# Patient Record
Sex: Male | Born: 1960 | Race: White | Hispanic: No | Marital: Married | Smoking: Never smoker
Health system: Southern US, Community
[De-identification: ages and names within clinical notes are randomized; demographics above are authoritative.]

## PROBLEM LIST (undated history)

## (undated) DIAGNOSIS — E785 Hyperlipidemia, unspecified: Secondary | ICD-10-CM

## (undated) DIAGNOSIS — M51369 Other intervertebral disc degeneration, lumbar region without mention of lumbar back pain or lower extremity pain: Secondary | ICD-10-CM

## (undated) DIAGNOSIS — I10 Essential (primary) hypertension: Secondary | ICD-10-CM

## (undated) DIAGNOSIS — E119 Type 2 diabetes mellitus without complications: Secondary | ICD-10-CM

## (undated) DIAGNOSIS — K8020 Calculus of gallbladder without cholecystitis without obstruction: Secondary | ICD-10-CM

## (undated) DIAGNOSIS — G4730 Sleep apnea, unspecified: Secondary | ICD-10-CM

## (undated) DIAGNOSIS — Z87442 Personal history of urinary calculi: Secondary | ICD-10-CM

## (undated) DIAGNOSIS — M5136 Other intervertebral disc degeneration, lumbar region: Secondary | ICD-10-CM

## (undated) HISTORY — PX: CHOLECYSTECTOMY: SHX55

## (undated) HISTORY — PX: LUMBAR MICRODISCECTOMY: SHX99

## (undated) HISTORY — PX: COLONOSCOPY: SHX174

## (undated) HISTORY — PX: HERNIA REPAIR: SHX51

## (undated) HISTORY — PX: BARIATRIC SURGERY: SHX1103

---

## 2005-07-31 ENCOUNTER — Inpatient Hospital Stay: Payer: Self-pay | Admitting: Internal Medicine

## 2010-07-14 ENCOUNTER — Inpatient Hospital Stay: Payer: Self-pay | Admitting: Internal Medicine

## 2010-10-28 ENCOUNTER — Emergency Department: Payer: Self-pay | Admitting: Emergency Medicine

## 2011-06-10 ENCOUNTER — Other Ambulatory Visit: Payer: Self-pay

## 2011-06-16 LAB — CULTURE, BLOOD (SINGLE)

## 2012-08-04 ENCOUNTER — Ambulatory Visit: Payer: Self-pay | Admitting: Gastroenterology

## 2013-08-30 ENCOUNTER — Ambulatory Visit: Payer: Self-pay | Admitting: Cardiology

## 2014-05-07 ENCOUNTER — Ambulatory Visit
Admit: 2014-05-07 | Disposition: A | Discharge status: Home / Self Care | Payer: Self-pay | Attending: Urgent Care | Admitting: Urgent Care

## 2014-06-18 ENCOUNTER — Other Ambulatory Visit: Payer: Self-pay | Admitting: Internal Medicine

## 2014-06-18 DIAGNOSIS — M5136 Other intervertebral disc degeneration, lumbar region: Secondary | ICD-10-CM

## 2014-06-19 ENCOUNTER — Ambulatory Visit: Payer: Self-pay

## 2014-07-20 ENCOUNTER — Ambulatory Visit: Payer: Disability Insurance | Attending: Internal Medicine

## 2014-07-20 ENCOUNTER — Ambulatory Visit: Payer: Self-pay

## 2014-07-20 DIAGNOSIS — E785 Hyperlipidemia, unspecified: Secondary | ICD-10-CM | POA: Insufficient documentation

## 2014-07-20 DIAGNOSIS — I10 Essential (primary) hypertension: Secondary | ICD-10-CM | POA: Diagnosis not present

## 2014-07-20 DIAGNOSIS — Z794 Long term (current) use of insulin: Secondary | ICD-10-CM | POA: Diagnosis not present

## 2014-07-20 DIAGNOSIS — M5136 Other intervertebral disc degeneration, lumbar region: Secondary | ICD-10-CM

## 2014-07-20 DIAGNOSIS — G4730 Sleep apnea, unspecified: Secondary | ICD-10-CM | POA: Diagnosis not present

## 2014-07-20 DIAGNOSIS — E118 Type 2 diabetes mellitus with unspecified complications: Secondary | ICD-10-CM | POA: Insufficient documentation

## 2014-07-24 MED ORDER — ALBUTEROL SULFATE (2.5 MG/3ML) 0.083% IN NEBU: 2.5000 mg | INHALATION_SOLUTION | Freq: Once | RESPIRATORY_TRACT | Status: DC

## 2014-10-10 ENCOUNTER — Other Ambulatory Visit: Payer: Self-pay | Admitting: Specialist

## 2014-10-19 ENCOUNTER — Ambulatory Visit
Admission: RE | Admit: 2014-10-19 | Discharge: 2014-10-19 | Disposition: A | Discharge status: Home / Self Care | Payer: BLUE CROSS/BLUE SHIELD | Source: Ambulatory Visit | Attending: Specialist | Admitting: Specialist

## 2014-10-19 DIAGNOSIS — Z01818 Encounter for other preprocedural examination: Secondary | ICD-10-CM | POA: Diagnosis present

## 2020-01-27 ENCOUNTER — Emergency Department: Payer: Medicare HMO

## 2020-01-27 ENCOUNTER — Emergency Department
Admission: EM | Admit: 2020-01-27 | Discharge: 2020-01-27 | Disposition: A | Discharge status: Home / Self Care | Payer: Medicare HMO | Attending: Student in an Organized Health Care Education/Training Program | Admitting: Student in an Organized Health Care Education/Training Program

## 2020-01-27 ENCOUNTER — Other Ambulatory Visit: Payer: Self-pay

## 2020-01-27 DIAGNOSIS — E119 Type 2 diabetes mellitus without complications: Secondary | ICD-10-CM | POA: Insufficient documentation

## 2020-01-27 DIAGNOSIS — S82851A Displaced trimalleolar fracture of right lower leg, initial encounter for closed fracture: Secondary | ICD-10-CM | POA: Diagnosis not present

## 2020-01-27 DIAGNOSIS — I10 Essential (primary) hypertension: Secondary | ICD-10-CM | POA: Insufficient documentation

## 2020-01-27 DIAGNOSIS — W541XXA Struck by dog, initial encounter: Secondary | ICD-10-CM | POA: Insufficient documentation

## 2020-01-27 DIAGNOSIS — Z20822 Contact with and (suspected) exposure to covid-19: Secondary | ICD-10-CM | POA: Diagnosis not present

## 2020-01-27 DIAGNOSIS — Y93K9 Activity, other involving animal care: Secondary | ICD-10-CM | POA: Diagnosis not present

## 2020-01-27 DIAGNOSIS — S99911A Unspecified injury of right ankle, initial encounter: Secondary | ICD-10-CM | POA: Diagnosis present

## 2020-01-27 HISTORY — DX: Type 2 diabetes mellitus without complications: E11.9

## 2020-01-27 HISTORY — DX: Essential (primary) hypertension: I10

## 2020-01-27 LAB — RESP PANEL BY RT-PCR (FLU A&B, COVID) ARPGX2
Influenza A by PCR: NEGATIVE
Influenza B by PCR: NEGATIVE
SARS Coronavirus 2 by RT PCR: NEGATIVE

## 2020-01-27 MED ORDER — HYDROMORPHONE HCL 1 MG/ML IJ SOLN
1.0000 mg | INTRAMUSCULAR | Status: DC | PRN
  Administered 2020-01-27: 1 mg via INTRAVENOUS

## 2020-01-27 MED ORDER — MORPHINE SULFATE (PF) 4 MG/ML IV SOLN
4.0000 mg | Freq: Once | INTRAVENOUS | Status: AC
  Administered 2020-01-27: 4 mg via INTRAVENOUS
  Filled 2020-01-27: qty 1

## 2020-01-27 MED ORDER — ONDANSETRON HCL 4 MG PO TABS: 4.0000 mg | ORAL_TABLET | Freq: Every day | ORAL | 0 refills | Status: AC | PRN

## 2020-01-27 MED ORDER — HYDROCODONE-ACETAMINOPHEN 5-325 MG PO TABS: 1.0000 | ORAL_TABLET | ORAL | 0 refills | Status: DC | PRN

## 2020-01-27 MED ORDER — HYDROMORPHONE HCL 1 MG/ML IJ SOLN
1.0000 mg | INTRAMUSCULAR | Status: DC | PRN
  Administered 2020-01-27: 1 mg via INTRAVENOUS
  Filled 2020-01-27 (×2): qty 1

## 2020-01-27 MED ORDER — ONDANSETRON HCL 4 MG/2ML IJ SOLN
4.0000 mg | Freq: Once | INTRAMUSCULAR | Status: AC
  Administered 2020-01-27: 4 mg via INTRAVENOUS
  Filled 2020-01-27: qty 2

## 2020-01-27 NOTE — ED Triage Notes (Signed)
Pt to ED from home, was hit by large dog around 1230pm today and has R ankle deformity with eversion of R ankle and swelling and bruising. Capillary refill <3s both feet. Pain level 6/10, aching. Pt has hx DM type 2 with bariatric surgery in 2016.

## 2020-01-27 NOTE — ED Notes (Signed)
Pain level 6/10. Dilaudid given per order for splint placement.

## 2020-01-27 NOTE — ED Notes (Signed)
Pt to ED with obvious R ankle deformity. Was hit by large dog. Pain level 6/10, throbbing.

## 2020-01-27 NOTE — Discharge Instructions (Signed)
Please keep leg elevated to help reduce swelling.  Call [facility] to schedule follow up appointment this coming week.  Make sure you are taking a daily aspirin to help reduce risk of blood clots.  Use crutches and do not put weight on your right foot.  Return to the ER if you have worsening pain, swelling, numbness or tingling or for any additional questions or concerns.

## 2020-01-27 NOTE — ED Notes (Signed)
EDP aware of neurovascular status, RLE. Pt has normal sensation in bilateral extremities.

## 2020-01-27 NOTE — ED Provider Notes (Signed)
[facility] Emergency Department Provider Note    Event Date/Time   First MD Initiated Contact with Patient 01/27/20 1347     (approximate)  I have reviewed the triage vital signs and the nursing notes.   HISTORY  Chief Complaint Ankle Pain and ankle deformity (Result of injury/)    HPI Taylor Massey is a 59 y.o. male below listed past medical history presents to the ER for evaluation of acute right ankle pain that occurred while he was playing with his dog today.  Dog ran into the back of his leg he felt a popping sensation in his ankle as he was making a turn.  Denies hitting his head.  No other associated pain.  States his pain is mild to moderate.  No numbness tingling.    Past Medical History:  Diagnosis Date  . Diabetes mellitus without complication (HCC)   . Hypertension    History reviewed. No pertinent family history.  There are no problems to display for this patient.     Prior to Admission medications   Medication Sig Start Date End Date Taking? Authorizing Provider  HYDROcodone-acetaminophen (NORCO) 5-325 MG tablet Take 1 tablet by mouth every 4 (four) hours as needed for moderate pain. 01/27/20   Willy Eddy, MD  ondansetron (ZOFRAN) 4 MG tablet Take 1 tablet (4 mg total) by mouth daily as needed. 01/27/20 01/26/21  Willy Eddy, MD    Allergies Patient has no known allergies.    Social History Social History   Tobacco Use  . Smoking status: Never Smoker  . Smokeless tobacco: Never Used  Substance Use Topics  . Alcohol use: Not Currently  . Drug use: Never    Review of Systems Patient denies headaches, rhinorrhea, blurry vision, numbness, shortness of breath, chest pain, edema, cough, abdominal pain, nausea, vomiting, diarrhea, dysuria, fevers, rashes or hallucinations unless otherwise stated above in HPI. ____________________________________________   PHYSICAL EXAM:  VITAL SIGNS: Vitals:   01/27/20  1414 01/27/20 1500  BP: 135/75 121/76  Pulse: (!) 59 (!) 53  Resp: 17 16  Temp: 98.7 F (37.1 C)   SpO2: 98% 100%    Constitutional: Alert and oriented.  Eyes: Conjunctivae are normal.  Head: Atraumatic. Nose: No congestion/rhinnorhea. Mouth/Throat: Mucous membranes are moist.   Neck: No stridor. Painless ROM.  Cardiovascular: Normal rate, regular rhythm. Grossly normal heart sounds.  Good peripheral circulation. Respiratory: Normal respiratory effort.  No retractions. Lungs CTAB. Gastrointestinal: Soft and nontender. No distention. No abdominal bruits. No CVA tenderness. Genitourinary:  Musculoskeletal: Lateral rotational deformity of the right ankle with swelling and ecchymosis.  No puncture wound laceration or abrasion.  Neurovascular intact distally.  No pain at proximal knee.  No joint effusions. Neurologic:  Normal speech and language. No gross focal neurologic deficits are appreciated. No facial droop Skin:  Skin is warm, dry and intact. No rash noted. Psychiatric: Mood and affect are normal. Speech and behavior are normal.  ____________________________________________   LABS (all labs ordered are listed, but only abnormal results are displayed)  Results for orders placed or performed during the hospital encounter of 01/27/20 (from the past 24 hour(s))  Resp Panel by RT-PCR (Flu A&B, Covid) Nasopharyngeal Swab     Status: None   Collection Time: 01/27/20  1:52 PM   Specimen: Nasopharyngeal Swab; Nasopharyngeal(NP) swabs in vial transport medium  Result Value Ref Range   SARS Coronavirus 2 by RT PCR NEGATIVE NEGATIVE   Influenza A by PCR NEGATIVE NEGATIVE  Influenza B by PCR NEGATIVE NEGATIVE   ____________________________________________ ____________________________________________  RADIOLOGY  I personally reviewed all radiographic images ordered to evaluate for the above acute complaints and reviewed radiology reports and findings.  These findings were personally  discussed with the patient.  Please see medical record for radiology report.  ____________________________________________   PROCEDURES  Procedure(s) performed:  .Ortho Injury Treatment  Date/Time: 01/27/2020 3:39 PM Performed by: Willy Eddy, MD Authorized by: Willy Eddy, MD   Consent:    Consent obtained:  Verbal   Consent given by:  Patient   Risks discussed:  Fracture, irreducible dislocation, recurrent dislocation, nerve damage, stiffness, vascular damage and restricted joint movement   Alternatives discussed:  Alternative treatment, immobilization and no treatmentInjury location: ankle Location details: right ankle Injury type: fracture Fracture type: trimalleolar Pre-procedure neurovascular assessment: neurovascularly intact Manipulation performed: yes Skin traction used: yes Reduction successful: yes X-ray confirmed reduction: yes Immobilization: splint Splint type: short leg Supplies used: Ortho-Glass Post-procedure neurovascular assessment: post-procedure neurovascularly intact       Critical Care performed: no ____________________________________________   INITIAL IMPRESSION / ASSESSMENT AND PLAN / ED COURSE  Pertinent labs & imaging results that were available during my care of the patient were reviewed by me and considered in my medical decision making (see chart for details).   DDX: Fracture, contusion, dislocation  Taylor Massey is a 59 y.o. who presents to the ED with evidence of right ankle injury as described above.  Bedside x-ray shows evidence of bimalleolar fracture however given mechanism suspect a component of posterior malleolar involvement as well.  He is neurovascularly intact.  Will give IV narcotic medication.  Will consult Ortho  Clinical Course as of 01/27/20 1546  Sat Jan 27, 2020  1422 Case was discussed in consultation with Dr. Loralie Champagne of orthopedics.  Does recommend reduction but no need for additional imaging of the  ear in the ER.  On reassessment patient pain fairly well controlled.  He does not have any proximal knee pain.  Neurovascular intact. [PR]  1518 Postreduction film shows improved alignment does appear to have a component of posterior malleoli are involvement but is appropriately positioned.  Images evaluated by Dr. Loralie Champagne.  Ok for follow with ortho clinic.  Will give crutches. [PR]    Clinical Course User Index [PR] Willy Eddy, MD    The patient was evaluated in Emergency Department today for the symptoms described in the history of present illness. He/she was evaluated in the context of the global COVID-19 pandemic, which necessitated consideration that the patient might be at risk for infection with the SARS-CoV-2 virus that causes COVID-19. Institutional protocols and algorithms that pertain to the evaluation of patients at risk for COVID-19 are in a state of rapid change based on information released by regulatory bodies including the CDC and federal and state organizations. These policies and algorithms were followed during the patient's care in the ED.  As part of my medical decision making, I reviewed the following data within the electronic MEDICAL RECORD NUMBER Nursing notes reviewed and incorporated, Labs reviewed, notes from prior ED visits and [place] Controlled Substance Database   ____________________________________________   FINAL CLINICAL IMPRESSION(S) / ED DIAGNOSES  Final diagnoses:  Trimalleolar fracture of ankle, closed, right, initial encounter      NEW MEDICATIONS STARTED DURING THIS VISIT:  New Prescriptions   HYDROCODONE-ACETAMINOPHEN (NORCO) 5-325 MG TABLET    Take 1 tablet by mouth every 4 (four) hours as needed for moderate pain.   ONDANSETRON (  ZOFRAN) 4 MG TABLET    Take 1 tablet (4 mg total) by mouth daily as needed.     Note:  This document was prepared using Dragon voice recognition software and may include unintentional dictation errors.    Willy Eddy, MD 01/27/20 [phone]

## 2020-01-27 NOTE — ED Notes (Addendum)
EDP at bedside performing manual reduction. Verbal order for second dilaudid injection.

## 2020-01-27 NOTE — ED Notes (Signed)
X-ray at bedside

## 2020-01-27 NOTE — ED Notes (Signed)
EDP at bedside  

## 2020-01-27 NOTE — ED Notes (Signed)
Pt feels ready to attempt ambulation in room with crutches. VS normal.

## 2020-01-27 NOTE — ED Notes (Signed)
Capillary refill noted to be <3sec both feet. Unable to feel pedal pulses. Will obtain pedal doppler. Xray at bedside.

## 2020-01-27 NOTE — ED Notes (Signed)
RR on vital signs is 16.

## 2020-01-31 ENCOUNTER — Other Ambulatory Visit: Payer: Self-pay

## 2020-01-31 ENCOUNTER — Other Ambulatory Visit
Admission: RE | Admit: 2020-01-31 | Discharge: 2020-01-31 | Disposition: A | Discharge status: Home / Self Care | Payer: Medicare HMO | Source: Ambulatory Visit | Attending: Podiatry | Admitting: Podiatry

## 2020-01-31 ENCOUNTER — Encounter
Admission: RE | Admit: 2020-01-31 | Discharge: 2020-01-31 | Disposition: A | Discharge status: Home / Self Care | Payer: Medicare HMO | Source: Ambulatory Visit | Attending: Podiatry | Admitting: Podiatry

## 2020-01-31 DIAGNOSIS — Z20822 Contact with and (suspected) exposure to covid-19: Secondary | ICD-10-CM | POA: Insufficient documentation

## 2020-01-31 DIAGNOSIS — Z01818 Encounter for other preprocedural examination: Secondary | ICD-10-CM | POA: Diagnosis not present

## 2020-01-31 HISTORY — DX: Other intervertebral disc degeneration, lumbar region: M51.36

## 2020-01-31 HISTORY — DX: Hyperlipidemia, unspecified: E78.5

## 2020-01-31 HISTORY — DX: Calculus of gallbladder without cholecystitis without obstruction: K80.20

## 2020-01-31 HISTORY — DX: Sleep apnea, unspecified: G47.30

## 2020-01-31 HISTORY — DX: Other intervertebral disc degeneration, lumbar region without mention of lumbar back pain or lower extremity pain: M51.369

## 2020-01-31 LAB — CBC
HCT: 37.4 % — ABNORMAL LOW (ref 39.0–52.0)
Hemoglobin: 12.6 g/dL — ABNORMAL LOW (ref 13.0–17.0)
MCH: 33.1 pg (ref 26.0–34.0)
MCHC: 33.7 g/dL (ref 30.0–36.0)
MCV: 98.2 fL (ref 80.0–100.0)
Platelets: 140 10*3/uL — ABNORMAL LOW (ref 150–400)
RBC: 3.81 MIL/uL — ABNORMAL LOW (ref 4.22–5.81)
RDW: 12.4 % (ref 11.5–15.5)
WBC: 5.5 10*3/uL (ref 4.0–10.5)
nRBC: 0 % (ref 0.0–0.2)

## 2020-01-31 LAB — COMPREHENSIVE METABOLIC PANEL
ALT: 25 U/L (ref 0–44)
AST: 28 U/L (ref 15–41)
Albumin: 3.7 g/dL (ref 3.5–5.0)
Alkaline Phosphatase: 91 U/L (ref 38–126)
Anion gap: 8 (ref 5–15)
BUN: 20 mg/dL (ref 6–20)
CO2: 26 mmol/L (ref 22–32)
Calcium: 8.8 mg/dL — ABNORMAL LOW (ref 8.9–10.3)
Chloride: 105 mmol/L (ref 98–111)
Creatinine, Ser: 0.99 mg/dL (ref 0.61–1.24)
GFR, Estimated: >60 mL/min (ref >60)
Glucose, Bld: 207 mg/dL — ABNORMAL HIGH (ref 70–99)
Potassium: 4.2 mmol/L (ref 3.5–5.1)
Sodium: 139 mmol/L (ref 135–145)
Total Bilirubin: 1 mg/dL (ref 0.3–1.2)
Total Protein: 6.8 g/dL (ref 6.5–8.1)

## 2020-01-31 LAB — SARS CORONAVIRUS 2 (TAT 6-24 HRS): SARS Coronavirus 2: NEGATIVE

## 2020-01-31 NOTE — Patient Instructions (Signed)
Your procedure is scheduled on: February 01, 2020 THURSDAY Report to the Registration Desk on the 1st floor of the [facility] To find out your arrival time, please call [phone] between 1PM - 3PM on: Wednesday January 31, 2020  REMEMBER: Instructions that are not followed completely may result in serious medical risk, up to and including death; or upon the discretion of your surgeon and anesthesiologist your surgery may need to be rescheduled.  Do not eat food after midnight the night before surgery.  No gum chewing, lozengers or hard candies.  You may however, drink CLEAR liquids up to 2 hours before you are scheduled to arrive for your surgery. Do not drink anything within 2 hours of your scheduled arrival time.  Clear liquids include: - water   Type 1 and Type 2 diabetics should only drink water.  TAKE THESE MEDICATIONS THE MORNING OF SURGERY WITH A SIP OF WATER: ATORVASTATIN - IF YOU USUALLY TAKE THAT IN THE MORNING  DO NOT TAKE BENAZEPRIL DAY OF SURGERY  Follow recommendations from Cardiologist, Pulmonologist or PCP regarding stopping Aspirin.  One week prior to surgery: Stop Anti-inflammatories (NSAIDS) such as Advil, Aleve, Ibuprofen, Motrin, Naproxen, Naprosyn and Aspirin based products such as Excedrin, Goodys Powder, BC Powder. Stop ANY OVER THE COUNTER supplements until after surgery. (However, you may continue taking Vitamin D, Vitamin B, and multivitamin up until the day before surgery.)  No Alcohol for 24 hours before or after surgery.  No Smoking including e-cigarettes for 24 hours prior to surgery.  No chewable tobacco products for at least 6 hours prior to surgery.  No nicotine patches on the day of surgery.  Do not use any "recreational" drugs for at least a week prior to your surgery.  Please be advised that the combination of cocaine and anesthesia may have negative outcomes, up to and including death. If you test positive for cocaine, your surgery  will be cancelled.  On the morning of surgery brush your teeth with toothpaste and water, you may rinse your mouth with mouthwash if you wish. Do not swallow any toothpaste or mouthwash.  Do not wear jewelry, make-up, hairpins, clips or nail polish.  Do not wear lotions, powders, or perfumes.   Do not shave body from the neck down 48 hours prior to surgery just in case you cut yourself which could leave a site for infection.  Also, freshly shaved skin may become irritated if using the CHG soap.  Contact lenses, hearing aids and dentures may not be worn into surgery.  Do not bring valuables to the hospital. [facility] is not responsible for any missing/lost belongings or valuables.   Use CHG Soap  as directed on instruction sheet.  If you use a C-PAP bring your C-PAP to the hospital with you in case you may have to spend the night.   Notify your doctor if there is any change in your medical condition (cold, fever, infection).  Wear comfortable clothing (specific to your surgery type) to the hospital.  Plan for stool softeners for home use; pain medications have a tendency to cause constipation. You can also help prevent constipation by eating foods high in fiber such as fruits and vegetables and drinking plenty of fluids as your diet allows.  After surgery, you can help prevent lung complications by doing breathing exercises.  Take deep breaths and cough every 1-2 hours. Your doctor may order a device called an Incentive Spirometer to help you take deep breaths. When coughing or  sneezing, hold a pillow firmly against your incision with both hands. This is called "splinting." Doing this helps protect your incision. It also decreases belly discomfort.  If you are being admitted to the hospital overnight, leave your suitcase in the car. After surgery it may be brought to your room.  If you are being discharged the day of surgery, you will not be allowed to drive home. You will need a  responsible adult (18 years or older) to drive you home and stay with you that night.   Please call the Pre-admissions Testing Dept. at [phone] if you have any questions about these instructions.  Visitation Policy:  Patients undergoing a surgery or procedure may have one family member or support person with them as long as that person is not COVID-19 positive or experiencing its symptoms.  That person may remain in the waiting area during the procedure.  Inpatient Visitation Update:   In an effort to ensure the safety of our team members and our patients, we are implementing a change to our visitation policy:  Effective Monday, Aug. 9, at 7 a.m., inpatients will be allowed one support person.  o The support person may change daily.  o The support person must pass our screening, gel in and out, and wear a mask at all times, including in the patient's room.  o Patients must also wear a mask when staff or their support person are in the room.  o Masking is required regardless of vaccination status.  Systemwide, no visitors 17 or younger.

## 2020-02-01 ENCOUNTER — Ambulatory Visit: Payer: Medicare HMO | Admitting: Anesthesiology

## 2020-02-01 ENCOUNTER — Other Ambulatory Visit: Payer: Self-pay

## 2020-02-01 ENCOUNTER — Observation Stay: Payer: Medicare HMO

## 2020-02-01 ENCOUNTER — Ambulatory Visit: Payer: Medicare HMO

## 2020-02-01 ENCOUNTER — Encounter: Payer: Self-pay | Admitting: Podiatry

## 2020-02-01 ENCOUNTER — Encounter
Admission: RE | Disposition: A | Discharge status: Home / Self Care | Payer: Self-pay | Source: Home / Self Care | Attending: Podiatry

## 2020-02-01 ENCOUNTER — Observation Stay
Admission: RE | Admit: 2020-02-01 | Discharge: 2020-02-01 | Disposition: A | Discharge status: Home / Self Care | Payer: Medicare HMO | Attending: Podiatry | Admitting: Podiatry

## 2020-02-01 DIAGNOSIS — I10 Essential (primary) hypertension: Secondary | ICD-10-CM | POA: Diagnosis not present

## 2020-02-01 DIAGNOSIS — S82851A Displaced trimalleolar fracture of right lower leg, initial encounter for closed fracture: Secondary | ICD-10-CM | POA: Diagnosis not present

## 2020-02-01 DIAGNOSIS — X58XXXA Exposure to other specified factors, initial encounter: Secondary | ICD-10-CM | POA: Insufficient documentation

## 2020-02-01 DIAGNOSIS — S82891A Other fracture of right lower leg, initial encounter for closed fracture: Secondary | ICD-10-CM | POA: Diagnosis present

## 2020-02-01 DIAGNOSIS — M6281 Muscle weakness (generalized): Secondary | ICD-10-CM | POA: Insufficient documentation

## 2020-02-01 DIAGNOSIS — E785 Hyperlipidemia, unspecified: Secondary | ICD-10-CM | POA: Diagnosis present

## 2020-02-01 DIAGNOSIS — E119 Type 2 diabetes mellitus without complications: Secondary | ICD-10-CM

## 2020-02-01 DIAGNOSIS — S99911A Unspecified injury of right ankle, initial encounter: Secondary | ICD-10-CM | POA: Diagnosis present

## 2020-02-01 DIAGNOSIS — Z09 Encounter for follow-up examination after completed treatment for conditions other than malignant neoplasm: Secondary | ICD-10-CM

## 2020-02-01 DIAGNOSIS — G4733 Obstructive sleep apnea (adult) (pediatric): Secondary | ICD-10-CM | POA: Diagnosis present

## 2020-02-01 DIAGNOSIS — Z419 Encounter for procedure for purposes other than remedying health state, unspecified: Secondary | ICD-10-CM

## 2020-02-01 HISTORY — PX: ORIF ANKLE FRACTURE: SHX5408

## 2020-02-01 LAB — GLUCOSE, CAPILLARY
Glucose-Capillary: 150 mg/dL — ABNORMAL HIGH (ref 70–99)
Glucose-Capillary: 125 mg/dL — ABNORMAL HIGH (ref 70–99)

## 2020-02-01 SURGERY — OPEN REDUCTION INTERNAL FIXATION (ORIF) ANKLE FRACTURE
Anesthesia: General | Site: Ankle | Laterality: Right

## 2020-02-01 MED ORDER — ACETAMINOPHEN 10 MG/ML IV SOLN
INTRAVENOUS | Status: AC
  Filled 2020-02-01: qty 100

## 2020-02-01 MED ORDER — KETOROLAC TROMETHAMINE 30 MG/ML IJ SOLN
INTRAMUSCULAR | Status: AC
  Filled 2020-02-01: qty 1

## 2020-02-01 MED ORDER — ENOXAPARIN SODIUM 40 MG/0.4ML ~~LOC~~ SOLN: 40.0000 mg | SUBCUTANEOUS | 0 refills | Status: DC

## 2020-02-01 MED ORDER — CHLORHEXIDINE GLUCONATE 0.12 % MT SOLN
OROMUCOSAL | Status: AC
  Administered 2020-02-01: 06:00:00 15 mL via OROMUCOSAL
  Filled 2020-02-01: qty 15

## 2020-02-01 MED ORDER — LIDOCAINE HCL (PF) 1 % IJ SOLN
INTRAMUSCULAR | Status: AC
  Filled 2020-02-01: qty 5

## 2020-02-01 MED ORDER — INSULIN ASPART 100 UNIT/ML ~~LOC~~ SOLN: 0.0000 [IU] | Freq: Three times a day (TID) | SUBCUTANEOUS | Status: DC

## 2020-02-01 MED ORDER — ACETAMINOPHEN 325 MG PO TABS: 325.0000 mg | ORAL_TABLET | Freq: Four times a day (QID) | ORAL | Status: DC | PRN

## 2020-02-01 MED ORDER — ACETAMINOPHEN 325 MG PO TABS: 650.0000 mg | ORAL_TABLET | Freq: Four times a day (QID) | ORAL | Status: DC | PRN

## 2020-02-01 MED ORDER — OCUVITE-LUTEIN PO CAPS
1.0000 | ORAL_CAPSULE | Freq: Every day | ORAL | Status: DC
  Filled 2020-02-01: qty 1

## 2020-02-01 MED ORDER — CEFAZOLIN SODIUM-DEXTROSE 2-4 GM/100ML-% IV SOLN
INTRAVENOUS | Status: AC
  Filled 2020-02-01: qty 100

## 2020-02-01 MED ORDER — SODIUM CHLORIDE FLUSH 0.9 % IV SOLN
INTRAVENOUS | Status: AC
  Filled 2020-02-01: qty 50

## 2020-02-01 MED ORDER — PROPOFOL 10 MG/ML IV BOLUS
INTRAVENOUS | Status: AC
  Filled 2020-02-01: qty 20

## 2020-02-01 MED ORDER — BENAZEPRIL HCL 20 MG PO TABS
20.0000 mg | ORAL_TABLET | Freq: Every day | ORAL | Status: DC
  Filled 2020-02-01: qty 1

## 2020-02-01 MED ORDER — FENTANYL CITRATE (PF) 100 MCG/2ML IJ SOLN: 25.0000 ug | INTRAMUSCULAR | Status: DC | PRN

## 2020-02-01 MED ORDER — MIDAZOLAM HCL 2 MG/2ML IJ SOLN
INTRAMUSCULAR | Status: AC
  Administered 2020-02-01: 08:00:00 1 mg via INTRAVENOUS
  Filled 2020-02-01: qty 2

## 2020-02-01 MED ORDER — ONDANSETRON HCL 4 MG/2ML IJ SOLN
INTRAMUSCULAR | Status: DC | PRN
  Administered 2020-02-01: 4 mg via INTRAVENOUS

## 2020-02-01 MED ORDER — CEFAZOLIN SODIUM-DEXTROSE 2-4 GM/100ML-% IV SOLN
2.0000 g | INTRAVENOUS | Status: AC
  Administered 2020-02-01: 2 g via INTRAVENOUS

## 2020-02-01 MED ORDER — ASPIRIN EC 81 MG PO TBEC: 81.0000 mg | DELAYED_RELEASE_TABLET | Freq: Two times a day (BID) | ORAL | 11 refills | Status: AC

## 2020-02-01 MED ORDER — ATORVASTATIN CALCIUM 10 MG PO TABS
10.0000 mg | ORAL_TABLET | Freq: Every day | ORAL | Status: DC
Start: 2020-02-01 — End: 2020-02-05
  Filled 2020-02-01: qty 1

## 2020-02-01 MED ORDER — INSULIN ASPART 100 UNIT/ML ~~LOC~~ SOLN: 0.0000 [IU] | Freq: Every day | SUBCUTANEOUS | Status: DC

## 2020-02-01 MED ORDER — FENTANYL CITRATE (PF) 100 MCG/2ML IJ SOLN
INTRAMUSCULAR | Status: DC | PRN
  Administered 2020-02-01 (×2): 50 ug via INTRAVENOUS

## 2020-02-01 MED ORDER — SODIUM CHLORIDE 0.9 % IV SOLN: INTRAVENOUS | Status: DC

## 2020-02-01 MED ORDER — MIDAZOLAM HCL 2 MG/2ML IJ SOLN
INTRAMUSCULAR | Status: DC | PRN
  Administered 2020-02-01: 1 mg via INTRAVENOUS

## 2020-02-01 MED ORDER — OXYCODONE-ACETAMINOPHEN 7.5-325 MG PO TABS: 1.0000 | ORAL_TABLET | Freq: Four times a day (QID) | ORAL | 0 refills | Status: AC | PRN

## 2020-02-01 MED ORDER — OXYCODONE HCL 5 MG PO TABS: 5.0000 mg | ORAL_TABLET | Freq: Once | ORAL | Status: DC | PRN

## 2020-02-01 MED ORDER — CHLORHEXIDINE GLUCONATE 0.12 % MT SOLN: 15.0000 mL | Freq: Once | OROMUCOSAL | Status: AC

## 2020-02-01 MED ORDER — CEPHALEXIN 500 MG PO CAPS: 500.0000 mg | ORAL_CAPSULE | Freq: Four times a day (QID) | ORAL | 0 refills | Status: AC

## 2020-02-01 MED ORDER — ACETAMINOPHEN 10 MG/ML IV SOLN
INTRAVENOUS | Status: DC | PRN
  Administered 2020-02-01: 1000 mg via INTRAVENOUS

## 2020-02-01 MED ORDER — LIDOCAINE HCL (CARDIAC) PF 100 MG/5ML IV SOSY
PREFILLED_SYRINGE | INTRAVENOUS | Status: DC | PRN
  Administered 2020-02-01: 80 mg via INTRAVENOUS

## 2020-02-01 MED ORDER — DEXMEDETOMIDINE (PRECEDEX) IN NS 20 MCG/5ML (4 MCG/ML) IV SYRINGE
PREFILLED_SYRINGE | INTRAVENOUS | Status: AC
  Filled 2020-02-01: qty 5

## 2020-02-01 MED ORDER — OXYCODONE-ACETAMINOPHEN 7.5-325 MG PO TABS: 1.0000 | ORAL_TABLET | Freq: Four times a day (QID) | ORAL | Status: DC | PRN

## 2020-02-01 MED ORDER — OXYCODONE HCL 5 MG/5ML PO SOLN: 5.0000 mg | Freq: Once | ORAL | Status: DC | PRN

## 2020-02-01 MED ORDER — MIDAZOLAM HCL 2 MG/2ML IJ SOLN: 1.0000 mg | Freq: Once | INTRAMUSCULAR | Status: AC

## 2020-02-01 MED ORDER — SILDENAFIL CITRATE 20 MG PO TABS
20.0000 mg | ORAL_TABLET | Freq: Every day | ORAL | Status: DC | PRN
  Filled 2020-02-01: qty 1

## 2020-02-01 MED ORDER — PROPOFOL 10 MG/ML IV BOLUS
INTRAVENOUS | Status: DC | PRN
  Administered 2020-02-01: 200 mg via INTRAVENOUS

## 2020-02-01 MED ORDER — BUPIVACAINE HCL (PF) 0.5 % IJ SOLN
INTRAMUSCULAR | Status: AC
  Filled 2020-02-01: qty 60

## 2020-02-01 MED ORDER — FAMOTIDINE 20 MG PO TABS
20.0000 mg | ORAL_TABLET | Freq: Once | ORAL | Status: AC
  Administered 2020-02-01: 07:00:00 20 mg via ORAL

## 2020-02-01 MED ORDER — LIDOCAINE HCL (PF) 2 % IJ SOLN
INTRAMUSCULAR | Status: AC
  Filled 2020-02-01: qty 5

## 2020-02-01 MED ORDER — EPHEDRINE SULFATE 50 MG/ML IJ SOLN
INTRAMUSCULAR | Status: DC | PRN
  Administered 2020-02-01: 10 mg via INTRAVENOUS

## 2020-02-01 MED ORDER — SUCCINYLCHOLINE CHLORIDE 200 MG/10ML IV SOSY
PREFILLED_SYRINGE | INTRAVENOUS | Status: AC
  Filled 2020-02-01: qty 10

## 2020-02-01 MED ORDER — FENTANYL CITRATE (PF) 100 MCG/2ML IJ SOLN
INTRAMUSCULAR | Status: AC
  Administered 2020-02-01: 08:00:00 50 ug via INTRAVENOUS
  Filled 2020-02-01: qty 2

## 2020-02-01 MED ORDER — ASPIRIN EC 81 MG PO TBEC
81.0000 mg | DELAYED_RELEASE_TABLET | Freq: Two times a day (BID) | ORAL | Status: DC
  Filled 2020-02-01: qty 1

## 2020-02-01 MED ORDER — DEXMEDETOMIDINE (PRECEDEX) IN NS 20 MCG/5ML (4 MCG/ML) IV SYRINGE
PREFILLED_SYRINGE | INTRAVENOUS | Status: DC | PRN
  Administered 2020-02-01 (×6): 4 ug via INTRAVENOUS

## 2020-02-01 MED ORDER — KETOROLAC TROMETHAMINE 30 MG/ML IJ SOLN
INTRAMUSCULAR | Status: DC | PRN
  Administered 2020-02-01: 30 mg via INTRAVENOUS

## 2020-02-01 MED ORDER — SEVOFLURANE IN SOLN
RESPIRATORY_TRACT | Status: AC
  Filled 2020-02-01: qty 250

## 2020-02-01 MED ORDER — SUGAMMADEX SODIUM 200 MG/2ML IV SOLN
INTRAVENOUS | Status: DC | PRN
  Administered 2020-02-01: 260 mg via INTRAVENOUS

## 2020-02-01 MED ORDER — SUCCINYLCHOLINE CHLORIDE 20 MG/ML IJ SOLN
INTRAMUSCULAR | Status: DC | PRN
  Administered 2020-02-01: 140 mg via INTRAVENOUS

## 2020-02-01 MED ORDER — LIDOCAINE HCL (PF) 1 % IJ SOLN
INTRAMUSCULAR | Status: DC | PRN
  Administered 2020-02-01 (×2): 1 mL

## 2020-02-01 MED ORDER — LIDOCAINE HCL (PF) 1 % IJ SOLN
INTRAMUSCULAR | Status: AC
  Filled 2020-02-01: qty 30

## 2020-02-01 MED ORDER — FAMOTIDINE 20 MG PO TABS
ORAL_TABLET | ORAL | Status: AC
  Filled 2020-02-01: qty 1

## 2020-02-01 MED ORDER — ROCURONIUM BROMIDE 10 MG/ML (PF) SYRINGE
PREFILLED_SYRINGE | INTRAVENOUS | Status: AC
  Filled 2020-02-01: qty 10

## 2020-02-01 MED ORDER — HYDRALAZINE HCL 20 MG/ML IJ SOLN
5.0000 mg | INTRAMUSCULAR | Status: DC | PRN
  Filled 2020-02-01: qty 0.25

## 2020-02-01 MED ORDER — ROPIVACAINE HCL 5 MG/ML IJ SOLN
INTRAMUSCULAR | Status: AC
  Filled 2020-02-01: qty 30

## 2020-02-01 MED ORDER — ORAL CARE MOUTH RINSE: 15.0000 mL | Freq: Once | OROMUCOSAL | Status: AC

## 2020-02-01 MED ORDER — BUPIVACAINE LIPOSOME 1.3 % IJ SUSP
INTRAMUSCULAR | Status: AC
  Filled 2020-02-01: qty 20

## 2020-02-01 MED ORDER — ONDANSETRON HCL 4 MG/2ML IJ SOLN: 4.0000 mg | Freq: Once | INTRAMUSCULAR | Status: DC | PRN

## 2020-02-01 MED ORDER — ROCURONIUM BROMIDE 100 MG/10ML IV SOLN
INTRAVENOUS | Status: DC | PRN
  Administered 2020-02-01: 20 mg via INTRAVENOUS
  Administered 2020-02-01: 40 mg via INTRAVENOUS

## 2020-02-01 MED ORDER — HYDROCODONE-ACETAMINOPHEN 7.5-325 MG PO TABS: 1.0000 | ORAL_TABLET | Freq: Four times a day (QID) | ORAL | Status: DC | PRN

## 2020-02-01 MED ORDER — ONDANSETRON HCL 4 MG/2ML IJ SOLN: 4.0000 mg | Freq: Three times a day (TID) | INTRAMUSCULAR | Status: DC | PRN

## 2020-02-01 MED ORDER — FENTANYL CITRATE (PF) 100 MCG/2ML IJ SOLN
INTRAMUSCULAR | Status: AC
  Filled 2020-02-01: qty 2

## 2020-02-01 MED ORDER — MIDAZOLAM HCL 2 MG/2ML IJ SOLN
INTRAMUSCULAR | Status: AC
  Filled 2020-02-01: qty 2

## 2020-02-01 MED ORDER — CALCIUM CITRATE-VITAMIN D 315-250 MG-UNIT PO TABS
ORAL_TABLET | Freq: Three times a day (TID) | ORAL | Status: DC
  Filled 2020-02-01: qty 1

## 2020-02-01 MED ORDER — ROPIVACAINE HCL 5 MG/ML IJ SOLN
INTRAMUSCULAR | Status: DC | PRN
  Administered 2020-02-01 (×6): 5 mL via EPIDURAL

## 2020-02-01 MED ORDER — DEXAMETHASONE SODIUM PHOSPHATE 10 MG/ML IJ SOLN
INTRAMUSCULAR | Status: DC | PRN
  Administered 2020-02-01: 8 mg via INTRAVENOUS

## 2020-02-01 MED ORDER — POVIDONE-IODINE 7.5 % EX SOLN
Freq: Once | CUTANEOUS | Status: DC
  Filled 2020-02-01: qty 118

## 2020-02-01 MED ORDER — FENTANYL CITRATE (PF) 100 MCG/2ML IJ SOLN: 50.0000 ug | Freq: Once | INTRAMUSCULAR | Status: AC

## 2020-02-01 SURGICAL SUPPLY — 71 items
BIT DRILL 2.4X140 LONG SOLID (BIT) ×2 IMPLANT
BIT DRILL CANNULTD 2.6 X 130MM (DRILL) ×1 IMPLANT
BLADE SURG 15 STRL LF DISP TIS (BLADE) IMPLANT
BLADE SURG 15 STRL SS (BLADE)
BLADE SURG 15 STRL SS SAFETY (BLADE) ×6 IMPLANT
BNDG COHESIVE 4X5 TAN STRL (GAUZE/BANDAGES/DRESSINGS) ×2 IMPLANT
BNDG CONFORM 2 STRL LF (GAUZE/BANDAGES/DRESSINGS) ×2 IMPLANT
BNDG CONFORM 3 STRL LF (GAUZE/BANDAGES/DRESSINGS) ×2 IMPLANT
BNDG ELASTIC 4X5.8 VLCR NS LF (GAUZE/BANDAGES/DRESSINGS) ×2 IMPLANT
BNDG ESMARK 4X12 TAN STRL LF (GAUZE/BANDAGES/DRESSINGS) ×2 IMPLANT
BNDG GAUZE 4.5X4.1 6PLY STRL (MISCELLANEOUS) ×2 IMPLANT
CANISTER SUCT 1200ML W/VALVE (MISCELLANEOUS) ×2 IMPLANT
COUNTERSICK 4.0 HEADED (MISCELLANEOUS) ×2
COVER WAND RF STERILE (DRAPES) ×2 IMPLANT
CUFF TOURN SGL QUICK 18X4 (TOURNIQUET CUFF) IMPLANT
CUFF TOURN SGL QUICK 24 (TOURNIQUET CUFF)
CUFF TOURN SGL QUICK 34 (TOURNIQUET CUFF) ×1
CUFF TRNQT CYL 24X4X16.5-23 (TOURNIQUET CUFF) IMPLANT
CUFF TRNQT CYL 34X4.125X (TOURNIQUET CUFF) ×1 IMPLANT
DRAPE C-ARM XRAY 36X54 (DRAPES) ×2 IMPLANT
DRAPE C-ARMOR (DRAPES) ×2 IMPLANT
DRILL CANNULATED 2.6 X 130MM (DRILL) ×2
DURAPREP 26ML APPLICATOR (WOUND CARE) ×2 IMPLANT
ELECT REM PT RETURN 9FT ADLT (ELECTROSURGICAL) ×2
ELECTRODE REM PT RTRN 9FT ADLT (ELECTROSURGICAL) ×1 IMPLANT
GAUZE SPONGE 4X4 12PLY STRL (GAUZE/BANDAGES/DRESSINGS) ×2 IMPLANT
GAUZE SPONGE 4X4 16PLY XRAY LF (GAUZE/BANDAGES/DRESSINGS) ×2 IMPLANT
GAUZE XEROFORM 1X8 LF (GAUZE/BANDAGES/DRESSINGS) ×4 IMPLANT
GLOVE BIO SURGEON STRL SZ7 (GLOVE) ×2 IMPLANT
GLOVE INDICATOR 7.0 STRL GRN (GLOVE) ×2 IMPLANT
GOWN STRL REUS W/ TWL LRG LVL3 (GOWN DISPOSABLE) ×3 IMPLANT
GOWN STRL REUS W/TWL LRG LVL3 (GOWN DISPOSABLE) ×3
K-WIRE SNGL END 1.2X150 (MISCELLANEOUS) ×4
KIT TURNOVER KIT A (KITS) ×2 IMPLANT
KWIRE SNGL END 1.2X150 (MISCELLANEOUS) ×2 IMPLANT
LABEL OR SOLS (LABEL) ×2 IMPLANT
MANIFOLD NEPTUNE II (INSTRUMENTS) ×2 IMPLANT
NEEDLE HYPO 22GX1.5 SAFETY (NEEDLE) ×2 IMPLANT
NS IRRIG 500ML POUR BTL (IV SOLUTION) ×2 IMPLANT
PACK EXTREMITY ARMC (MISCELLANEOUS) ×2 IMPLANT
PAD PREP 24X41 OB/GYN DISP (PERSONAL CARE ITEMS) ×2 IMPLANT
PADDING CAST 4IN STRL (MISCELLANEOUS) ×1
PADDING CAST BLEND 4X4 STRL (MISCELLANEOUS) ×1 IMPLANT
PLATE FIB 11H ANATOMICAL RT (Plate) ×2 IMPLANT
PUTTY DBX 1CC (Putty) ×2 IMPLANT
PUTTY DBX 1CC DEPUY (Putty) ×1 IMPLANT
SCREW 4.0X50 LONG THREAD (Screw) ×2 IMPLANT
SCREW CANN HEAD LT 4.0X46 (Screw) ×2 IMPLANT
SCREW COUNTERSINK 4.0 HEADED (MISCELLANEOUS) ×1 IMPLANT
SCREW LOCK PLATE R3 3.5X10 (Screw) ×4 IMPLANT
SCREW LOCK PLATE R3 3.5X12 (Screw) ×6 IMPLANT
SCREW LOCK PLATE R3 3.5X14 (Screw) ×8 IMPLANT
SCREW LOCK PLATE R3 3.5X16 (Screw) ×2 IMPLANT
SPLINT CAST 1 STEP 4X30 (MISCELLANEOUS) ×2 IMPLANT
SPLINT FAST PLASTER 5X30 (CAST SUPPLIES) ×1
SPLINT PLASTER CAST FAST 5X30 (CAST SUPPLIES) ×1 IMPLANT
SPONGE LAP 18X18 RF (DISPOSABLE) ×2 IMPLANT
STAPLER SKIN PROX 35W (STAPLE) ×2 IMPLANT
STOCKINETTE M/LG 89821 (MISCELLANEOUS) ×2 IMPLANT
STRAP SAFETY 5IN WIDE (MISCELLANEOUS) ×2 IMPLANT
STRIP CLOSURE SKIN 1/2X4 (GAUZE/BANDAGES/DRESSINGS) IMPLANT
SUT ETHILON 3-0 FS-10 30 BLK (SUTURE) ×6
SUT VIC AB 2-0 CT1 27 (SUTURE) ×1
SUT VIC AB 2-0 CT1 TAPERPNT 27 (SUTURE) ×1 IMPLANT
SUT VIC AB 3-0 SH 27 (SUTURE) ×3
SUT VIC AB 3-0 SH 27X BRD (SUTURE) ×3 IMPLANT
SUTURE EHLN 3-0 FS-10 30 BLK (SUTURE) ×3 IMPLANT
SWABSTK COMLB BENZOIN TINCTURE (MISCELLANEOUS) IMPLANT
SYR 10ML LL (SYRINGE) ×2 IMPLANT
SYR 50ML LL SCALE MARK (SYRINGE) ×2 IMPLANT
WIRE OLIVE SMOOTH 1.4MMX60MM (WIRE) ×6 IMPLANT

## 2020-02-01 NOTE — Op Note (Signed)
PODIATRY / FOOT AND ANKLE SURGERY OPERATIVE REPORT  SURGEON: Rosetta Posner, DPM  PRE-OPERATIVE DIAGNOSIS:  1.  Right trimalleolar ankle fracture, unstable, closed  POST-OPERATIVE DIAGNOSIS: Same  PROCEDURE(S): 1. Right trimalleolar ankle fracture open reduction internal fixation  HEMOSTASIS: Right thigh tourniquet, 120 minutes  ANESTHESIA: general, popliteal and saphenous nerve block performed prior to anesthesia by anesthesia  ESTIMATED BLOOD LOSS: 30 cc  FINDING(S): 1.  Slight comminution present at the distal fibular fracture slightly distal to the syndesmotic ligament right. 2.  Medial malleolar transverse/oblique type fracture, simple 3.  Posterior malleolar fracture which appeared to be minimally displaced that after reduction of the lateral malleolus and medial malleolus it appeared to be near anatomic to minimally displaced overall  PATHOLOGY/SPECIMEN(S): None  INDICATIONS:   Taylor Massey is a 59 y.o. male who presents with an unstable right ankle fracture after suffering a fall about 5 days ago.  Patient was seen in the ED after falling over his dog in which he fractured his right ankle.  Patient was placed in a splint after reduction was performed and sent to clinic for further evaluation and management.  All treatment options were discussed with the patient both conservative and surgical attempts at correction including potential risks and complications and at this time patient has elected for a right trimalleolar ankle fracture with open reduction and internal fixation.  All questions answered including postoperative course in detail.  DESCRIPTION: After obtaining full informed written consent, the patient was brought back to the operating room and placed supine upon the operating table.  A preoperative popliteal and saphenous nerve block was performed by anesthesia.  The patient received IV antibiotics prior to induction.  After obtaining adequate anesthesia, the patient  was prepped and draped in the standard fashion.  C-arm imaging was used to verify incision placement over the distal fibula as well as medial malleolus.  An Esmarch bandage was used to exsanguinate the right lower extremity pneumatic thigh tourniquet was inflated.  Attention was then directed to the lateral malleolus distally where a linear longitudinal incision was made over the distal fibula to the tip of the fibula and distal shaft.  The incision was deepened to the subcutaneous tissues utilizing sharp and blunt dissection care was taken to identify and retract all vital neurovascular structures all venous contributories were cauterized as necessary.  A periosteal incision was then made into the distal fibula and the periosteum was reflected anteriorly and posteriorly thereby exposing the distal fibular fracture at the operative site.  The distal fibular fracture appeared to be fairly comminuted and did have an anterior piece that held a portion of the syndesmotic ligament.  Some small bone fragments were removed that appeared to be within soft tissues at that level.  The fracture was then opened further with a dental pick and debridement with a curette and rondure.  The bone void that was present was then filled with demineralized bone putty/matrix.  The bone void appeared to be well filled.  The fibula was then held in a reduced position rotating the distal fibula distally and anteriorly thereby improving the length of the fibula.  This was then held in position with a reduction clamp.  A distal fibular locking plate was then placed with temporary olive wire fixation while holding the fibula out to length and the reduced position.  At this time distal locking screws were placed.  All screws that were used were 3 5 locking screws.  Approximately 5 screws were placed distal  to the fracture site and 4 were placed proximal to the fracture site with the plate bent in the appropriate position.  All distal screws  were unicortical locking 3 5 screws in all proximal screws were bicortical 3 5 locking screws.  The fibula appeared to be reduced overall on both the AP and lateral views and it appeared to be out to length.  The medial malleolar fragment appeared to be reduced at this time once the lateral malleolar fracture was reduced.  The posterior malleolar fracture fragment appeared to be minimally displaced overall and appeared to involve barely any of the articular surface of the ankle joint, approximately 5%.  The cotton hook test was performed as well as supination external rotation test to test for syndesmotic instability and no gapping was apparent at the syndesmosis indicating no tear of the syndesmotic ligaments.  Attention was then directed to the medial aspect of the right ankle along the course the medial malleolus and distal tibia where a linear longitudinal incision was made between the posterior and anterior colliculus.  The incision was deepened to the subcutaneous tissues utilizing sharp and blunt dissection and care was taken to identify and retract all vital neurovascular structures and all venous contributories were cauterized as necessary.  At this time a periosteal incision was then made over the fracture site and the periosteum was reflected proximally and distally thereby exposing the fracture site.  There appeared to be a rather large hematoma within the fracture site which was cleared along with bone fragments.  There did appear to be free-floating bone fragments at the medial gutter area which was removed but did create a small bone void at the fracture site.  The medial malleolar fracture fragment appeared to be slightly oblique at the operative site.  A reduction clamp was then used to hold the medial malleolar fracture fragment in a reduced position.  At this time a guidewire for a 4.0 cannulated Paragon 28 screw was placed across the fracture site at the anterior colliculus and another was  placed parallel to that between the posterior and anterior colliculus.  These pins were placed parallel to each other.  The reduction clamp was removed and the C arm was used to assess reduction.  The medial gutter appeared to be clear at this time the ankle appeared to have good range of motion overall with no crepitus.  At this time one 4.0 x 4 6 mm and one 4.0 x 50 mm cannulated Paragon 28 screw partially threaded was placed across the fracture sites with excellent compression noted.  The small bone void that was present was also filled with DBM/bone putty.  C-arm imaging was taken again showing excellent reduction of the fibula as well as medial malleolus.  The posterior malleolar fracture fragment was then assessed again and noted to be very minimally displaced overall and involved around 5% or less of the articular surface of the ankle.  It was determined at this time to not put any fixation across the posterior malleolar fracture as it appeared to be very stable and minimally displaced involved such a small portion of the articular surface.  The ankle was brought through range of motion again after fixation was completed and noted to be very stable at this time with no crepitation or instability present.  The surgical sites were flushed with copious amounts normal sterile saline.  The periosteum and capsular tissues were then reapproximated well coapted with 3-0 Vicryl.  The subcutaneous tissue was reapproximated well  coapted with 3-0 Vicryl.  The skin was then reapproximated well coapted with a combination of staples and 3-0 nylon in horizontal mattress type stitching.  During suturing the pneumatic thigh tourniquet was released and a prompt hyperemic response was noted all digits of the right foot.  A postoperative dressing was then applied consisting of Xeroform to the incision lines followed by 4 x 4 gauze, Kerlix, web roll, posterior splint, Ace wrap.  Patient tolerated the procedure and anesthesia  well was transferred to recovery room vital signs stable vascular status intact all toes the right foot.  Patient will be discharged with the appropriate follow-up instructions and medications.  Patient should follow-up in clinic within 1 week of surgical date.  Physical therapy has been ordered prior to discharge to work with the patient on gait training with nonweightbearing to the right lower extremity all times using crutches, knee scooter, wheelchair and for stair training.  COMPLICATIONS: None  CONDITION: Good, stable  Rosetta Posner, DPM

## 2020-02-01 NOTE — Progress Notes (Signed)
   02/01/20 0742  Clinical Encounter Type  Visited With Family  Visit Type Initial  Referral From Chaplain  Consult/Referral To Chaplain  While rounding [facility] waiting area, chaplain briefly visited with Pt's wife, Steward Drone, who said she was tire and had been up since 3:30. It took time helping her husband get ready with a broken ankle. She said that Pt's surgery would be at least 3 1/2 hours and chaplain told her maybe she could get a nap during that time and she said yes, that was her intentions. She said her husband would be here for a few hours. Chaplain wished her well and a Happy New Year.

## 2020-02-01 NOTE — Transfer of Care (Signed)
Immediate Anesthesia Transfer of Care Note  Patient: ZAKKERY DORIAN  Procedure(s) Performed: OPEN REDUCTION INTERNAL FIXATION (ORIF) ANKLE FRACTURE (Right Ankle)  Patient Location: PACU  Anesthesia Type:General  Level of Consciousness: sedated  Airway & Oxygen Therapy: Patient Spontanous Breathing and Patient connected to face mask oxygen  Post-op Assessment: Report given to RN and Post -op Vital signs reviewed and stable  Post vital signs: Reviewed and stable  Last Vitals:  Vitals Value Taken Time  BP 149/86 02/01/20 1045  Temp 36.8 C 02/01/20 1045  Pulse 79 02/01/20 1048  Resp 12 02/01/20 1048  SpO2 100 % 02/01/20 1048  Vitals shown include unvalidated device data.  Last Pain:  Vitals:   02/01/20 0745  TempSrc:   PainSc: 0-No pain      Patients Stated Pain Goal: 0 (02/01/20 4503)  Complications: No complications documented.

## 2020-02-01 NOTE — Discharge Instructions (Signed)
      AMBULATORY SURGERY  DISCHARGE INSTRUCTIONS   1) The drugs that you were given will stay in your system until tomorrow so for the next 24 hours you should not:  A) Drive an automobile B) Make any legal decisions C) Drink any alcoholic beverage   2) You may resume regular meals tomorrow.  Today it is better to start with liquids and gradually work up to solid foods.  You may eat anything you prefer, but it is better to start with liquids, then soup and crackers, and gradually work up to solid foods.   3) Please notify your doctor immediately if you have any unusual bleeding, trouble breathing, redness and pain at the surgery site, drainage, fever, or pain not relieved by medication.    4) Additional Instructions:        Please contact your physician with any problems or Same Day Surgery at [phone], Monday through Friday 6 am to 4 pm, or [place] at [facility] number at [phone].[facility]  POST OPERATIVE INSTRUCTIONS FOR DR. Carollee Leitz CLINIC PODIATRY DEPARTMENT   1. Take your medication as prescribed.  Pain medication should be taken only as needed.  You may take ibuprofen or Tylenol between pain medication doses.  Recommend taking 1 Norco 7.5 tablet every 6 hours as needed for pain.  If still having uncontrolled pain after taking 1 tablet and taking ibuprofen or Tylenol between doses then you may try taking 2 tablets every 6 hours as needed for relief.  The maximum dose of Tylenol or acetaminophen daily is 4000 mg.  The maximum dose of ibuprofen is 3200 mg daily.  Would recommend trying to more so take Tylenol over ibuprofen as it has been shown that anti-inflammatories sometimes can reduce bone healing ability.  If pain is still uncontrolled then you could try taking 2 Norco tablets every 4 hours as needed but try to take this medication as sparingly as possible.  If you do run out of pain medication please  call our office for further pain medicine or if having further uncontrolled pain please seek medical attention more promptly.  2. Keep the dressing clean, dry and intact. 3. Take Lovenox medication as prescribed 24 hours after surgical procedure. 4. Take antibiotics as prescribed until gone.  5. Keep your foot elevated above the heart level for the first 48 hours.  Continue the elevating thereafter for swelling control.  May also ice for maximum 10 minutes out of every 1 hour as needed for relief.  Recommend placing around the right ankle.  6. Walking to the bathroom and brief periods of walking are acceptable, unless we have instructed you to be non-weight bearing.  7. Remain nonweightbearing at all times to the right lower extremity.  Do not put any pressure on the right foot.  Recommend using a knee scooter, wheelchair, or crutches.  8. Do not take a shower. Baths are permissible as long as the foot is kept out of the water.   9. Every hour you are awake:  - Bend your knee 15 times. - Massage calf 15 times  10. Call [facility] [phone]) if any of the following problems occur: - You develop a temperature or fever. - The bandage becomes saturated with blood. - Medication does not stop your pain. - Injury of the foot occurs. - Any symptoms of infection including redness, odor, or red streaks running from wound.

## 2020-02-01 NOTE — H&P (Addendum)
HISTORY AND PHYSICAL INTERVAL NOTE:  02/01/2020  7:22 AM  Taylor Massey  has presented today for surgery, with the diagnosis of displaced right ankle fracture.  The various methods of treatment have been discussed with the patient.  No guarantees were given.  After consideration of risks, benefits and other options for treatment, the patient has consented to surgery.  I have reviewed the patients' chart and labs.    PROCEDURE:  1. RIGHT TRIMALLEOLAR ANKLE FRACTURE ORIF 2. USAGE OF FLUOROSCOPY 3. APPLICATION OF POSTERIOR SPLINT  Patient will be staying after procedure for 23 hour observation and will need PT consult after procedure prior to discharge.  A history and physical examination was performed in my office.  The patient was reexamined.  There have been no changes to this history and physical examination.  Rosetta Posner, DPM

## 2020-02-01 NOTE — Anesthesia Procedure Notes (Signed)
Procedure Name: Intubation Date/Time: 02/01/2020 8:01 AM Performed by: Henrietta Hoover, CRNA Pre-anesthesia Checklist: Patient identified, Patient being monitored, Timeout performed, Emergency Drugs available and Suction available Patient Re-evaluated:Patient Re-evaluated prior to induction Oxygen Delivery Method: Circle system utilized Preoxygenation: Pre-oxygenation with 100% oxygen Induction Type: IV induction Ventilation: Mask ventilation without difficulty Laryngoscope Size: McGraph and 4 Grade View: Grade I Tube type: Oral Tube size: 7.5 mm Number of attempts: 1 Airway Equipment and Method: Stylet Placement Confirmation: ETT inserted through vocal cords under direct vision,  positive ETCO2 and breath sounds checked- equal and bilateral Secured at: 23 cm Tube secured with: Tape Dental Injury: Teeth and Oropharynx as per pre-operative assessment

## 2020-02-01 NOTE — Evaluation (Signed)
Physical Therapy Evaluation Patient Details Name: Taylor Massey MRN: [id number] DOB: Mar 13, 1960 Today's Date: 02/01/2020   History of Present Illness  Pt admitted for closed R ankle fracture and is now s/p ORIF this date with dc to home orders.  Clinical Impression  Pt is a pleasant 59 year old male who was admitted for R ankle fracture and is s/p ORIF. Pt performs bed mobility with cga, transfers with min assist, and ambulation with cga and BRW. Demonstrates safe technique to transfer between surfaces. Pt and family feel comfortable with home discharge this date. Pt does not require any further PT needs at this time. Pt will be dc in house and does not require follow up. RN aware. Will dc current orders. Will need formalized OP PT once able to WB on R LE.     Follow Up Recommendations Outpatient PT    Equipment Recommendations  None recommended by PT    Recommendations for Other Services       Precautions / Restrictions Precautions Precautions: Fall Restrictions Weight Bearing Restrictions: Yes RLE Weight Bearing: Non weight bearing      Mobility  Bed Mobility Overal bed mobility: Needs Assistance Bed Mobility: Supine to Sit     Supine to sit: Min guard     General bed mobility comments: safe technique with upright posture. Needs cues    Transfers Overall transfer level: Needs assistance Equipment used: Rolling walker (2 wheeled) Transfers: Sit to/from Stand Sit to Stand: Min assist         General transfer comment: upright posture. Needs rocking momentum prior to transfer. Initial L knee blocking, however not needed. Able to maintain correct WBing once standing.  Ambulation/Gait Ambulation/Gait assistance: Min guard Gait Distance (Feet): 3 Feet Assistive device: Rolling walker (2 wheeled) Gait Pattern/deviations: Step-to pattern     General Gait Details: Performed pivoting to/from chair. Able to maintain WBing status and pivot on ball of foot. Very  hesitant and anxious about all mobility.  Stairs            Wheelchair Mobility    Modified Rankin (Stroke Patients Only)       Balance Overall balance assessment: Needs assistance Sitting-balance support: Bilateral upper extremity supported Sitting balance-Leahy Scale: Good     Standing balance support: Bilateral upper extremity supported Standing balance-Leahy Scale: Good                               Pertinent Vitals/Pain Pain Assessment: Faces Faces Pain Scale: Hurts little more Pain Location: R ankle Pain Descriptors / Indicators: Operative site guarding Pain Intervention(s): Limited activity within patient's tolerance;Ice applied;Premedicated before session    [place] expects to be discharged to:: Private residence Living Arrangements: Spouse/significant other Available Help at Discharge: Family;Available 24 hours/day Type of Home: House Home Access: Ramped entrance     Home Layout: One level Home Equipment: Walker - 2 wheels;Crutches;Wheelchair - manual (knee scooter)      Prior Function Level of Independence: Independent         Comments: was previously indep without fall history. Injury occured by dog running into him     Hand Dominance        Extremity/Trunk Assessment   Upper Extremity Assessment Upper Extremity Assessment: Overall WFL for tasks assessed    Lower Extremity Assessment Lower Extremity Assessment: Generalized weakness (R LE grossly 3+/5; L LE 4/5)       Communication   Communication:  No difficulties  °Cognition Arousal/Alertness: Awake/alert °Behavior During Therapy: WFL for tasks assessed/performed °Overall Cognitive Status: Within Functional Limits for tasks assessed °  °  °  °  °  °  °  °  °  °  °  °  °  °  °  °  °  °  ° °  °General Comments   ° °  °Exercises Other Exercises °Other Exercises: seated ther-ex performed on B LE including LAQ and alt marching x 10 reps. cues given for  technique and educated on frequency and duration.  ° °Assessment/Plan  °  °PT Assessment All further PT needs can be met in the next venue of care  °PT Problem List Decreased strength;Decreased activity tolerance;Decreased balance;Decreased mobility ° °   °  °PT Treatment Interventions     ° °PT Goals (Current goals can be found in the Care Plan section)  °Acute Rehab PT Goals °Patient Stated Goal: to go home °PT Goal Formulation: All assessment and education complete, DC therapy °Time For Goal Achievement: 02/01/20 °Potential to Achieve Goals: Good ° °  °Frequency   °  °Barriers to discharge   °   ° ° °Co-evaluation   °  °  °  °  ° ° °  °AM-PAC PT "6 Clicks" Mobility  °Outcome Measure Help needed turning from your back to your side while in a flat bed without using bedrails?: A Little °Help needed moving from lying on your back to sitting on the side of a flat bed without using bedrails?: A Little °Help needed moving to and from a bed to a chair (including a wheelchair)?: A Little °Help needed standing up from a chair using your arms (e.g., wheelchair or bedside chair)?: A Little °Help needed to walk in hospital room?: A Lot °Help needed climbing 3-5 steps with a railing? : Total °6 Click Score: 15 ° °  °End of Session Equipment Utilized During Treatment: Gait belt °Activity Tolerance: Patient tolerated treatment well °Patient left: in bed;with family/visitor present °Nurse Communication: Mobility status °PT Visit Diagnosis: Muscle weakness (generalized) (M62.81) °  ° °Time: 1402-1434 °PT Time Calculation (min) (ACUTE ONLY): 32 min ° ° °Charges:   PT Evaluation °$PT Eval Moderate Complexity: 1 Mod °PT Treatments °$Therapeutic Exercise: 8-22 mins °  °   ° ° °Stephanie Ray, PT, DPT °[phone] ° ° °Ray,Stephanie °02/01/2020, 3:01 PM ° ° °

## 2020-02-01 NOTE — Consult Note (Signed)
Medical Consultation   Taylor Massey  BTD:[id number]  DOB: 07/04/1960  DOA: 02/01/2020  PCP: Lauro RegulusAnderson, Marshall W, MD   Outpatient Specialists:    Requesting physician: Dr. Excell SeltzerBaker of podiatry  Reason for consultation: -Management of chronic medical issues   History of Present Illness: Taylor Massey is an 59 y.o. male hypertension, hyperlipidemia, diabetes mellitus, OSA, DDD, who was admitted by Dr. Excell SeltzerBaker for podiatry due to right ankle fracture. Patient is s/p of surgery today. We are asked to consult on managing chronic medical issues.  Pt state that he injured his right ankle on Christmas night and developed right ankle fracture. Pt had X-ray done 12/25 which showed trimalleolar fracture of the right ankle with improved alignment and improved alignment of the ankle mortise. Pt underwent successful surgery of right trimalleolar ankle fracture ORIF by Dr. Excell SeltzerBaker today.  Patient has mild tenderness in the surgical site.  Denies chest pain, shortness of breath, cough, fever or chills.  No nausea vomiting, diarrhea, abdominal pain, symptoms of UTI.  Lab: WBC 5.5, negative Covid PCR, electrolytes renal function okay. Temperature 99.1, blood pressure 130/78, heart rate 89, RR 20, oxygen saturation 92% on room air.  Review of Systems:   General: no fevers, chills, no changes in body weight, no changes in appetite Skin: no rash HEENT: no blurry vision, hearing changes or sore throat Pulm: no dyspnea, coughing, wheezing CV: no chest pain, palpitations, shortness of breath Abd: no nausea/vomiting, abdominal pain, diarrhea/constipation GU: no dysuria, hematuria, polyuria Ext: has right ankle pain Neuro: no weakness, numbness, or tingling    Past Medical History: Past Medical History:  Diagnosis Date  . Cholelithiasis   . DDD (degenerative disc disease), lumbar   . Diabetes mellitus without complication (HCC)   . Hyperlipemia   . Hypertension   . Sleep apnea      Past Surgical History: Past Surgical History:  Procedure Laterality Date  . BARIATRIC SURGERY    . CHOLECYSTECTOMY    . COLONOSCOPY    . HERNIA REPAIR     HIATAL DONE WITH BARIATIC  . LUMBAR MICRODISCECTOMY       Allergies:  No Known Allergies   Social History:  reports that he has never smoked. He has never used smokeless tobacco. He reports previous alcohol use. He reports that he does not use drugs.   Family History: History reviewed. No pertinent family history.   Physical Exam: Vitals:   02/01/20 1130 02/01/20 1140 02/01/20 1302 02/01/20 1435  BP: (!) 146/77 134/87 (P) 131/78 (!) 150/78  Pulse: 64 62 (P) 66 63  Resp: 12 16 (P) 16 18  Temp:  98.1 F (36.7 C)  98.6 F (37 C)  TempSrc:  Temporal  Temporal  SpO2: 98% 100% (P) 95% 94%  Weight:      Height:        General: Not in acute distress HEENT: PERRL, EOMI, no scleral icterus, No JVD or bruit Cardiac: S1/S2, RRR, No murmurs, gallops or rubs Pulm: No rales, wheezing, rhonchi or rubs. Abd: Soft, nondistended, nontender, no rebound pain, no organomegaly, BS present Ext: No edema. 1+DP/PT pulse bilaterally Musculoskeletal: s/p of surgery of right ankle with some tenderness in surgical site Skin: No rashes.  Neuro: Alert and oriented X3, cranial nerves II-XII grossly intact Psych: Patient is not psychotic, no suicidal or hemocidal ideation.     Data reviewed:  I have personally reviewed following  labs and imaging studies Labs:  CBC: Recent Labs  Lab 01/31/20 1104  WBC 5.5  HGB 12.6*  HCT 37.4*  MCV 98.2  PLT 140*    Basic Metabolic Panel: Recent Labs  Lab 01/31/20 1104  NA 139  K 4.2  CL 105  CO2 26  GLUCOSE 207*  BUN 20  CREATININE 0.99  CALCIUM 8.8*   GFR Estimated Creatinine Clearance: 115.6 mL/min (by C-G formula based on SCr of 0.99 mg/dL). Liver Function Tests: Recent Labs  Lab 01/31/20 1104  AST 28  ALT 25  ALKPHOS 91  BILITOT 1.0  PROT 6.8  ALBUMIN 3.7   No  results for input(s): LIPASE, AMYLASE in the last 168 hours. No results for input(s): AMMONIA in the last 168 hours. Coagulation profile No results for input(s): INR, PROTIME in the last 168 hours.  Cardiac Enzymes: No results for input(s): CKTOTAL, CKMB, CKMBINDEX, TROPONINI in the last 168 hours. BNP: Invalid input(s): POCBNP CBG: Recent Labs  Lab 02/01/20 0630 02/01/20 1047  GLUCAP 150* 125*   D-Dimer No results for input(s): DDIMER in the last 72 hours. Hgb A1c No results for input(s): HGBA1C in the last 72 hours. Lipid Profile No results for input(s): CHOL, HDL, LDLCALC, TRIG, CHOLHDL, LDLDIRECT in the last 72 hours. Thyroid function studies No results for input(s): TSH, T4TOTAL, T3FREE, THYROIDAB in the last 72 hours.  Invalid input(s): FREET3 Anemia work up No results for input(s): VITAMINB12, FOLATE, FERRITIN, TIBC, IRON, RETICCTPCT in the last 72 hours. Urinalysis No results found for: COLORURINE, APPEARANCEUR, LABSPEC, PHURINE, GLUCOSEU, HGBUR, BILIRUBINUR, KETONESUR, PROTEINUR, UROBILINOGEN, NITRITE, LEUKOCYTESUR   Microbiology Recent Results (from the past 240 hour(s))  Resp Panel by RT-PCR (Flu A&B, Covid) Nasopharyngeal Swab     Status: None   Collection Time: 01/27/20  1:52 PM   Specimen: Nasopharyngeal Swab; Nasopharyngeal(NP) swabs in vial transport medium  Result Value Ref Range Status   SARS Coronavirus 2 by RT PCR NEGATIVE NEGATIVE Final    Comment: (NOTE) SARS-CoV-2 target nucleic acids are NOT DETECTED.  The SARS-CoV-2 RNA is generally detectable in upper respiratory specimens during the acute phase of infection. The lowest concentration of SARS-CoV-2 viral copies this assay can detect is 138 copies/mL. A negative result does not preclude SARS-Cov-2 infection and should not be used as the sole basis for treatment or other patient management decisions. A negative result may occur with  improper specimen collection/handling, submission of specimen  other than nasopharyngeal swab, presence of viral mutation(s) within the areas targeted by this assay, and inadequate number of viral copies(<138 copies/mL). A negative result must be combined with clinical observations, patient history, and epidemiological information. The expected result is Negative.  Fact Sheet for Patients:  BloggerCourse.com  Fact Sheet for Healthcare Providers:  SeriousBroker.it  This test is no t yet approved or cleared by the [place] FDA and  has been authorized for detection and/or diagnosis of SARS-CoV-2 by FDA under an Emergency Use Authorization (EUA). This EUA will remain  in effect (meaning this test can be used) for the duration of the COVID-19 declaration under Section 564(b)(1) of the Act, 21 U.S.C.section 360bbb-3(b)(1), unless the authorization is terminated  or revoked sooner.       Influenza A by PCR NEGATIVE NEGATIVE Final   Influenza B by PCR NEGATIVE NEGATIVE Final    Comment: (NOTE) The Xpert Xpress SARS-CoV-2/FLU/RSV plus assay is intended as an aid in the diagnosis of influenza from Nasopharyngeal swab specimens and should not be used as a  sole basis for treatment. Nasal washings and aspirates are unacceptable for Xpert Xpress SARS-CoV-2/FLU/RSV testing.  Fact Sheet for Patients: BloggerCourse.com  Fact Sheet for Healthcare Providers: SeriousBroker.it  This test is not yet approved or cleared by the [place] FDA and has been authorized for detection and/or diagnosis of SARS-CoV-2 by FDA under an Emergency Use Authorization (EUA). This EUA will remain in effect (meaning this test can be used) for the duration of the COVID-19 declaration under Section 564(b)(1) of the Act, 21 U.S.C. section 360bbb-3(b)(1), unless the authorization is terminated or revoked.  Performed at [facility], [address]  [place], [place] [address]   SARS CORONAVIRUS 2 (TAT 6-24 HRS) Nasopharyngeal Nasopharyngeal Swab     Status: None   Collection Time: 01/31/20 10:28 AM   Specimen: Nasopharyngeal Swab  Result Value Ref Range Status   SARS Coronavirus 2 NEGATIVE NEGATIVE Final    Comment: (NOTE) SARS-CoV-2 target nucleic acids are NOT DETECTED.  The SARS-CoV-2 RNA is generally detectable in upper and lower respiratory specimens during the acute phase of infection. Negative results do not preclude SARS-CoV-2 infection, do not rule out co-infections with other pathogens, and should not be used as the sole basis for treatment or other patient management decisions. Negative results must be combined with clinical observations, patient history, and epidemiological information. The expected result is Negative.  Fact Sheet for Patients: HairSlick.no  Fact Sheet for Healthcare Providers: quierodirigir.com  This test is not yet approved or cleared by the [place] FDA and  has been authorized for detection and/or diagnosis of SARS-CoV-2 by FDA under an Emergency Use Authorization (EUA). This EUA will remain  in effect (meaning this test can be used) for the duration of the COVID-19 declaration under Se ction 564(b)(1) of the Act, 21 U.S.C. section 360bbb-3(b)(1), unless the authorization is terminated or revoked sooner.  Performed at [facility], [address] [place], [place] [address]        Inpatient Medications:   Scheduled Meds: . [START ON 02/02/2020] aspirin EC  81 mg Oral BID  . atorvastatin  10 mg Oral Daily  . [START ON 02/02/2020] Bariatric Multivitamins/Iron  1 tablet Oral Daily  . benazepril  20 mg Oral Daily  . Calcium Citrate-Vitamin D   Oral TID  . famotidine      . insulin aspart  0-5 Units Subcutaneous QHS  . insulin aspart  0-9 Units Subcutaneous TID WC   Continuous Infusions: . sodium chloride 10 mL/hr at  02/01/20 3428     Radiological Exams on Admission: DG Ankle 2 Views Right  Result Date: 02/01/2020 CLINICAL DATA:  ORIF right ankle, right ankle fracture EXAM: RIGHT ANKLE - 2 VIEW; DG C-ARM 1-60 MIN COMPARISON:  02/01/2020 FINDINGS: Distal fibular plate and screw fixator laterally with 4 proximal and 5 distal screws. This fixator traverses the oblique lateral malleolar fracture. Near anatomic alignment. Two lag screw fixation of the medial malleolar fracture fragment observed with near anatomic alignment. Posterior malleolar fragment noted. IMPRESSION: 1. Status post ORIF of right ankle trimalleolar fracture, with near anatomic alignment of the medial and lateral malleolus and no complicating feature identified. Electronically Signed   By: Gaylyn Rong M.D.   On: 02/01/2020 10:23   DG Ankle Complete Right  Result Date: 02/01/2020 CLINICAL DATA:  The patient suffered right ankle fractures in a fall 01/27/2020. Status post ORIF. EXAM: RIGHT ANKLE - COMPLETE 3+ VIEW COMPARISON:  Plain films right ankle 01/27/2020. FINDINGS: The patient is in  a posterior splint. Medial and lateral malleolar fractures have been fixed in anatomic position and alignment. Minimally displaced posterior malleolar fracture noted. The tibiotalar joint is reduced. No new abnormality is seen. Soft tissue swelling noted. IMPRESSION: Status post ORIF of medial and lateral malleolar fractures. No acute finding. Electronically Signed   By: Drusilla Kanner M.D.   On: 02/01/2020 11:39   DG C-Arm 1-60 Min  Result Date: 02/01/2020 CLINICAL DATA:  ORIF right ankle, right ankle fracture EXAM: RIGHT ANKLE - 2 VIEW; DG C-ARM 1-60 MIN COMPARISON:  02/01/2020 FINDINGS: Distal fibular plate and screw fixator laterally with 4 proximal and 5 distal screws. This fixator traverses the oblique lateral malleolar fracture. Near anatomic alignment. Two lag screw fixation of the medial malleolar fracture fragment observed with near anatomic  alignment. Posterior malleolar fragment noted. IMPRESSION: 1. Status post ORIF of right ankle trimalleolar fracture, with near anatomic alignment of the medial and lateral malleolus and no complicating feature identified. Electronically Signed   By: Gaylyn Rong M.D.   On: 02/01/2020 10:23   [place] OR NERVE BLOCK-IMAGE ONLY [facility])  Result Date: 02/01/2020 There is no interpretation for this exam.  This order is for images obtained during a surgical procedure.  Please See "Surgeries" Tab for more information regarding the procedure.    Impression/Recommendations Principal Problem:   Closed right ankle fracture Active Problems:   Diabetes mellitus without complication (HCC)   Hyperlipemia   Hypertension   OSA (obstructive sleep apnea)   Closed right ankle fracture: s/p of surgery today. -pain management per primary podiatry team -Per Dr. Excell Seltzer, will hold off heparin or Lovenox prophylaxis today, may start tomorrow  Diet controlled diabetes mellitus without complication [facility]): Recent A1c 5.6, well controlled.  Blood sugar 2 7 today.  Patient is not taking medications currently -Start sliding scale insulin  Hyperlipemia -Lipitor  Hypertension -IV hydralazine as needed -Lotensin  OSA -CPAP    Thank you for this consultation.  Our [facility] hospitalist team will follow the patient with you.   Time Spent:  20 min  Lorretta Harp M.D. [facility] 02/01/2020, 6:42 PM       :

## 2020-02-01 NOTE — Anesthesia Preprocedure Evaluation (Addendum)
Anesthesia Evaluation  Patient identified by MRN, date of birth, ID band Patient awake    Reviewed: Allergy & Precautions, H&P , NPO status , Patient's Chart, lab work & pertinent test results  History of Anesthesia Complications Negative for: history of anesthetic complications  Airway Mallampati: II  TM Distance: >3 FB     Dental  (+) Teeth Intact   Pulmonary sleep apnea and Continuous Positive Airway Pressure Ventilation , neg COPD,    breath sounds clear to auscultation       Cardiovascular hypertension, (-) angina(-) Past MI and (-) Cardiac Stents (-) dysrhythmias  Rhythm:regular Rate:Normal     Neuro/Psych negative neurological ROS  negative psych ROS   GI/Hepatic negative GI ROS, Neg liver ROS,   Endo/Other  diabetesMorbid obesity  Renal/GU      Musculoskeletal   Abdominal   Peds  Hematology negative hematology ROS (+)   Anesthesia Other Findings Past Medical History: No date: Cholelithiasis No date: DDD (degenerative disc disease), lumbar No date: Diabetes mellitus without complication (HCC) No date: Hyperlipemia No date: Hypertension No date: Sleep apnea  Past Surgical History: No date: BARIATRIC SURGERY No date: CHOLECYSTECTOMY No date: COLONOSCOPY No date: HERNIA REPAIR     Comment:  HIATAL DONE WITH BARIATIC No date: LUMBAR MICRODISCECTOMY  BMI    Body Mass Index: 38.26 kg/m      Reproductive/Obstetrics negative OB ROS                            Anesthesia Physical Anesthesia Plan  ASA: III  Anesthesia Plan: General ETT   Post-op Pain Management: GA combined w/ Regional for post-op pain   Induction:   PONV Risk Score and Plan: Ondansetron, Dexamethasone, Midazolam and Treatment may vary due to age or medical condition  Airway Management Planned:   Additional Equipment:   Intra-op Plan:   Post-operative Plan:   Informed Consent: I have reviewed the  patients History and Physical, chart, labs and discussed the procedure including the risks, benefits and alternatives for the proposed anesthesia with the patient or authorized representative who has indicated his/her understanding and acceptance.     Dental Advisory Given  Plan Discussed with: Anesthesiologist, CRNA and Surgeon  Anesthesia Plan Comments:        Anesthesia Quick Evaluation

## 2020-02-01 NOTE — Anesthesia Procedure Notes (Signed)
Anesthesia Regional Block: Popliteal block   Pre-Anesthetic Checklist: ,, timeout performed, Correct Patient, Correct Site, Correct Laterality, Correct Procedure, Correct Position, site marked, Risks and benefits discussed,  Surgical consent,  Pre-op evaluation,  At surgeon's request and post-op pain management  Laterality: Right  Prep: chloraprep       Needles:  Injection technique: Single-shot  Needle Type: Stimiplex     Needle Length: 9cm  Needle Gauge: 21     Additional Needles:   Procedures:,,,, ultrasound used (permanent image in chart),,,,  Narrative:  Start time: 02/01/2020 7:30 AM End time: 02/01/2020 7:45 AM  Performed by: Personally  Anesthesiologist: Karleen Hampshire, MD  Additional Notes: Saphenous single shot block 15 mL ropivacaine 0.5% Popliteal single shot block 15 mL ropivacaine 0.5%  Risks and benefits of nerve block discussed with patient, including but not limited to risk of nerve injury, bleeding, infection, and failed block.  Patient expressed understanding and consented to block placement.   Functioning IV was confirmed and monitors were applied.  Sterile prep,hand hygiene and sterile gloves were used.  Minimal sedation used for procedure.  During the procedure, there was negative aspiration, negative paresthesia on injection, and dose was given in divided aliquots under ultrasound guidance.  Patient tolerated the procedure well with no immediate complications.

## 2020-02-02 ENCOUNTER — Telehealth: Payer: Self-pay

## 2020-02-02 ENCOUNTER — Encounter: Payer: Self-pay | Admitting: Podiatry

## 2020-02-02 MED ORDER — HYDROCODONE-ACETAMINOPHEN 7.5-325 MG PO TABS
1.0000 | ORAL_TABLET | Freq: Four times a day (QID) | ORAL | Status: DC | PRN
Start: 2020-02-02 — End: 2020-02-05

## 2020-02-02 NOTE — Anesthesia Postprocedure Evaluation (Signed)
Anesthesia Post Note  Patient: Taylor Massey  Procedure(s) Performed: OPEN REDUCTION INTERNAL FIXATION (ORIF) ANKLE FRACTURE (Right Ankle)  Patient location during evaluation: PACU Anesthesia Type: General Level of consciousness: awake and alert Pain management: pain level controlled Vital Signs Assessment: post-procedure vital signs reviewed and stable Respiratory status: spontaneous breathing, nonlabored ventilation and respiratory function stable Cardiovascular status: blood pressure returned to baseline and stable Postop Assessment: no apparent nausea or vomiting Anesthetic complications: no   No complications documented.   Last Vitals:  Vitals:   02/01/20 1302 02/01/20 1435  BP: 131/78 (!) 150/78  Pulse: 66 63  Resp: 16 18  Temp:  37 C  SpO2: 95% 94%    Last Pain:  Vitals:   02/01/20 1435  TempSrc: Temporal  PainSc: 1                  Karleen Hampshire

## 2021-05-11 ENCOUNTER — Other Ambulatory Visit: Payer: Self-pay

## 2021-05-11 ENCOUNTER — Emergency Department
Admission: EM | Admit: 2021-05-11 | Discharge: 2021-05-11 | Disposition: A | Discharge status: Home / Self Care | Payer: Medicare HMO | Attending: Emergency Medicine | Admitting: Emergency Medicine

## 2021-05-11 ENCOUNTER — Emergency Department: Payer: Medicare HMO

## 2021-05-11 DIAGNOSIS — N200 Calculus of kidney: Secondary | ICD-10-CM | POA: Insufficient documentation

## 2021-05-11 DIAGNOSIS — I10 Essential (primary) hypertension: Secondary | ICD-10-CM | POA: Insufficient documentation

## 2021-05-11 DIAGNOSIS — E119 Type 2 diabetes mellitus without complications: Secondary | ICD-10-CM | POA: Diagnosis not present

## 2021-05-11 DIAGNOSIS — R1031 Right lower quadrant pain: Secondary | ICD-10-CM | POA: Diagnosis present

## 2021-05-11 LAB — COMPREHENSIVE METABOLIC PANEL
ALT: 44 U/L (ref 0–44)
AST: 49 U/L — ABNORMAL HIGH (ref 15–41)
Albumin: 3.6 g/dL (ref 3.5–5.0)
Alkaline Phosphatase: 124 U/L (ref 38–126)
Anion gap: 4 — ABNORMAL LOW (ref 5–15)
BUN: 13 mg/dL (ref 6–20)
CO2: 26 mmol/L (ref 22–32)
Calcium: 8.7 mg/dL — ABNORMAL LOW (ref 8.9–10.3)
Chloride: 106 mmol/L (ref 98–111)
Creatinine, Ser: 0.97 mg/dL (ref 0.61–1.24)
GFR, Estimated: >60 mL/min (ref >60)
Glucose, Bld: 240 mg/dL — ABNORMAL HIGH (ref 70–99)
Potassium: 4.7 mmol/L (ref 3.5–5.1)
Sodium: 136 mmol/L (ref 135–145)
Total Bilirubin: 0.8 mg/dL (ref 0.3–1.2)
Total Protein: 6.9 g/dL (ref 6.5–8.1)

## 2021-05-11 LAB — URINALYSIS, ROUTINE W REFLEX MICROSCOPIC
Bacteria, UA: NONE SEEN
Bilirubin Urine: NEGATIVE
Glucose, UA: >=500 mg/dL — AB
Hgb urine dipstick: NEGATIVE
Ketones, ur: NEGATIVE mg/dL
Leukocytes,Ua: NEGATIVE
Nitrite: NEGATIVE
Protein, ur: 30 mg/dL — AB
Specific Gravity, Urine: 1.024 (ref 1.005–1.030)
pH: 5 (ref 5.0–8.0)

## 2021-05-11 LAB — CBC
HCT: 42.4 % (ref 39.0–52.0)
Hemoglobin: 13.7 g/dL (ref 13.0–17.0)
MCH: 31.4 pg (ref 26.0–34.0)
MCHC: 32.3 g/dL (ref 30.0–36.0)
MCV: 97 fL (ref 80.0–100.0)
Platelets: 130 10*3/uL — ABNORMAL LOW (ref 150–400)
RBC: 4.37 MIL/uL (ref 4.22–5.81)
RDW: 12.8 % (ref 11.5–15.5)
WBC: 4.9 10*3/uL (ref 4.0–10.5)
nRBC: 0 % (ref 0.0–0.2)

## 2021-05-11 LAB — LIPASE, BLOOD: Lipase: 25 U/L (ref 11–51)

## 2021-05-11 MED ORDER — ONDANSETRON HCL 4 MG/2ML IJ SOLN
4.0000 mg | Freq: Once | INTRAMUSCULAR | Status: AC
  Administered 2021-05-11: 4 mg via INTRAVENOUS
  Filled 2021-05-11: qty 2

## 2021-05-11 MED ORDER — NAPROXEN 500 MG PO TABS: 500.0000 mg | ORAL_TABLET | Freq: Two times a day (BID) | ORAL | 2 refills | Status: AC

## 2021-05-11 MED ORDER — ONDANSETRON 4 MG PO TBDP: 4.0000 mg | ORAL_TABLET | Freq: Three times a day (TID) | ORAL | 0 refills | Status: AC | PRN

## 2021-05-11 MED ORDER — KETOROLAC TROMETHAMINE 30 MG/ML IJ SOLN
30.0000 mg | Freq: Once | INTRAMUSCULAR | Status: AC
  Administered 2021-05-11: 30 mg via INTRAVENOUS
  Filled 2021-05-11: qty 1

## 2021-05-11 MED ORDER — OXYCODONE-ACETAMINOPHEN 5-325 MG PO TABS: 1.0000 | ORAL_TABLET | ORAL | 0 refills | Status: AC | PRN

## 2021-05-11 MED ORDER — TAMSULOSIN HCL 0.4 MG PO CAPS: 0.4000 mg | ORAL_CAPSULE | Freq: Every day | ORAL | 0 refills | Status: AC

## 2021-05-11 MED ORDER — TAMSULOSIN HCL 0.4 MG PO CAPS: 0.4000 mg | ORAL_CAPSULE | Freq: Every day | ORAL | 0 refills | Status: DC

## 2021-05-11 NOTE — ED Triage Notes (Signed)
Pt c/o RLQ pain with N/V since waking this morning, denies fever or diarrhea. ?

## 2021-05-11 NOTE — ED Notes (Signed)
Pt presents to the ED for RLQ pain that started when he woke up this AM, pt states he was fine last night, states that he vomited once and does not have that much nausea any more. Pt denies hx of kidney stones and pt still has appendix. Denies urinary symptoms. Denies fever. Denies diarrhea. States he has a hx of bariatric surgery, cholecystectomy and hernia repair. Pt is A&OX4 and NAD.  ?

## 2021-05-11 NOTE — ED Provider Notes (Signed)
? ?[facility] ?Provider Note ? ? ? Event Date/Time  ? First MD Initiated Contact with Patient 05/11/21 1318   ?  (approximate) ? ? ?History  ? ?Abdominal Pain ? ? ?HPI ? ?Taylor Massey is a 61 y.o. male with a history of bariatric surgery, cholecystectomy, diabetes, hypertension who presents with complaints of right lower quadrant abdominal pain.  Patient describes pain in the abdomen over the last 24 hours at times severe.  No radiation to the groin.  Has not take anything for it.  Positive nausea and vomiting ? ? ?Physical Exam  ? ?Triage Vital Signs: ?ED Triage Vitals  ?Enc Vitals Group  ?   BP 05/11/21 1258 (!) 199/90  ?   Pulse Rate 05/11/21 1256 (!) 58  ?   Resp 05/11/21 1256 18  ?   Temp 05/11/21 1256 98.8 ?F (37.1 ?C)  ?   Temp Source 05/11/21 1256 Oral  ?   SpO2 05/11/21 1256 100 %  ?   Weight 05/11/21 1251 136.1 kg (300 lb)  ?   Height 05/11/21 1251 1.854 m (6\' 1" )  ?   Head Circumference --   ?   Peak Flow --   ?   Pain Score 05/11/21 1251 4  ?   Pain Loc --   ?   Pain Edu? --   ?   Excl. in GC? --   ? ? ?Most recent vital signs: ?Vitals:  ? 05/11/21 1404 05/11/21 1430  ?BP: (!) 161/84 (!) 153/76  ?Pulse: (!) 52 (!) 54  ?Resp: 20 20  ?Temp:    ?SpO2: 95% 96%  ? ? ? ?General: Awake, no distress.  ?CV:  Good peripheral perfusion.  ?Resp:  Normal effort.  ?Abd:  No distention.  Mild tenderness right lower quadrant, no CVA tenderness ?Other:   ? ? ?ED Results / Procedures / Treatments  ? ?Labs ?(all labs ordered are listed, but only abnormal results are displayed) ?Labs Reviewed  ?COMPREHENSIVE METABOLIC PANEL - Abnormal; Notable for the following components:  ?    Result Value  ? Glucose, Bld 240 (*)   ? Calcium 8.7 (*)   ? AST 49 (*)   ? Anion gap 4 (*)   ? All other components within normal limits  ?CBC - Abnormal; Notable for the following components:  ? Platelets 130 (*)   ? All other components within normal limits  ?URINALYSIS, ROUTINE W REFLEX MICROSCOPIC - Abnormal; Notable  for the following components:  ? Color, Urine YELLOW (*)   ? APPearance CLEAR (*)   ? Glucose, UA >=500 (*)   ? Protein, ur 30 (*)   ? All other components within normal limits  ?LIPASE, BLOOD  ? ? ? ?EKG ? ? ? ? ?RADIOLOGY ? ?CT renal stone study reviewed by me, right mid ureter stone noted ? ? ?PROCEDURES: ? ?Critical Care performed:  ? ?Procedures ? ? ?MEDICATIONS ORDERED IN ED: ?Medications  ?ketorolac (TORADOL) 30 MG/ML injection 30 mg (30 mg Intravenous Given 05/11/21 1354)  ?ondansetron (ZOFRAN) injection 4 mg (4 mg Intravenous Given 05/11/21 1354)  ? ? ? ?IMPRESSION / MDM / ASSESSMENT AND PLAN / ED COURSE  ?I reviewed the triage vital signs and the nursing notes. ? ? ? ?Patient presents with right lower quadrant abdominal pain as detailed above.  Differential includes appendicitis versus ureterolithiasis, less likely diverticulitis/colitis. ? ?Abdominal exam is overall reassuring, lab work demonstrates no clear hematuria.  White blood cell count is normal. ? ?  Treat with IV Toradol and IV Zofran and obtain CT renal stone study ? ?CT scan demonstrates right-sided ureterolithiasis.  Pain is resolved after treatment.  Lab work is reassuring, urinalysis without evidence of infection.  Appropriate for discharge with outpatient follow-up with urology, return precautions discussed ? ?  ? ? ?FINAL CLINICAL IMPRESSION(S) / ED DIAGNOSES  ? ?Final diagnoses:  ?Kidney stone  ? ? ? ?Rx / DC Orders  ? ?ED Discharge Orders   ? ?      Ordered  ?  oxyCODONE-acetaminophen (PERCOCET) 5-325 MG tablet  Every 4 hours PRN       ? 05/11/21 1501  ?  ondansetron (ZOFRAN-ODT) 4 MG disintegrating tablet  Every 8 hours PRN       ? 05/11/21 1501  ?  tamsulosin (FLOMAX) 0.4 MG CAPS capsule  Daily,   Status:  Discontinued       ? 05/11/21 1501  ?  naproxen (NAPROSYN) 500 MG tablet  2 times daily with meals       ? 05/11/21 1501  ?  tamsulosin (FLOMAX) 0.4 MG CAPS capsule  Daily       ? 05/11/21 1505  ? ?  ?  ? ?  ? ? ? ?Note:  This document was  prepared using Dragon voice recognition software and may include unintentional dictation errors. ?  ?Jene Every, MD ?05/12/21 [phone] ? ?

## 2022-02-06 IMAGING — DX DG ANKLE 2V *R*
2 series · 2 of 2 positions shown · non-contrast
Comparison: Same day radiographs

CLINICAL DATA: Right ankle fracture status post reduction

EXAM:
RIGHT ANKLE - 2 VIEW

[ankle ap]
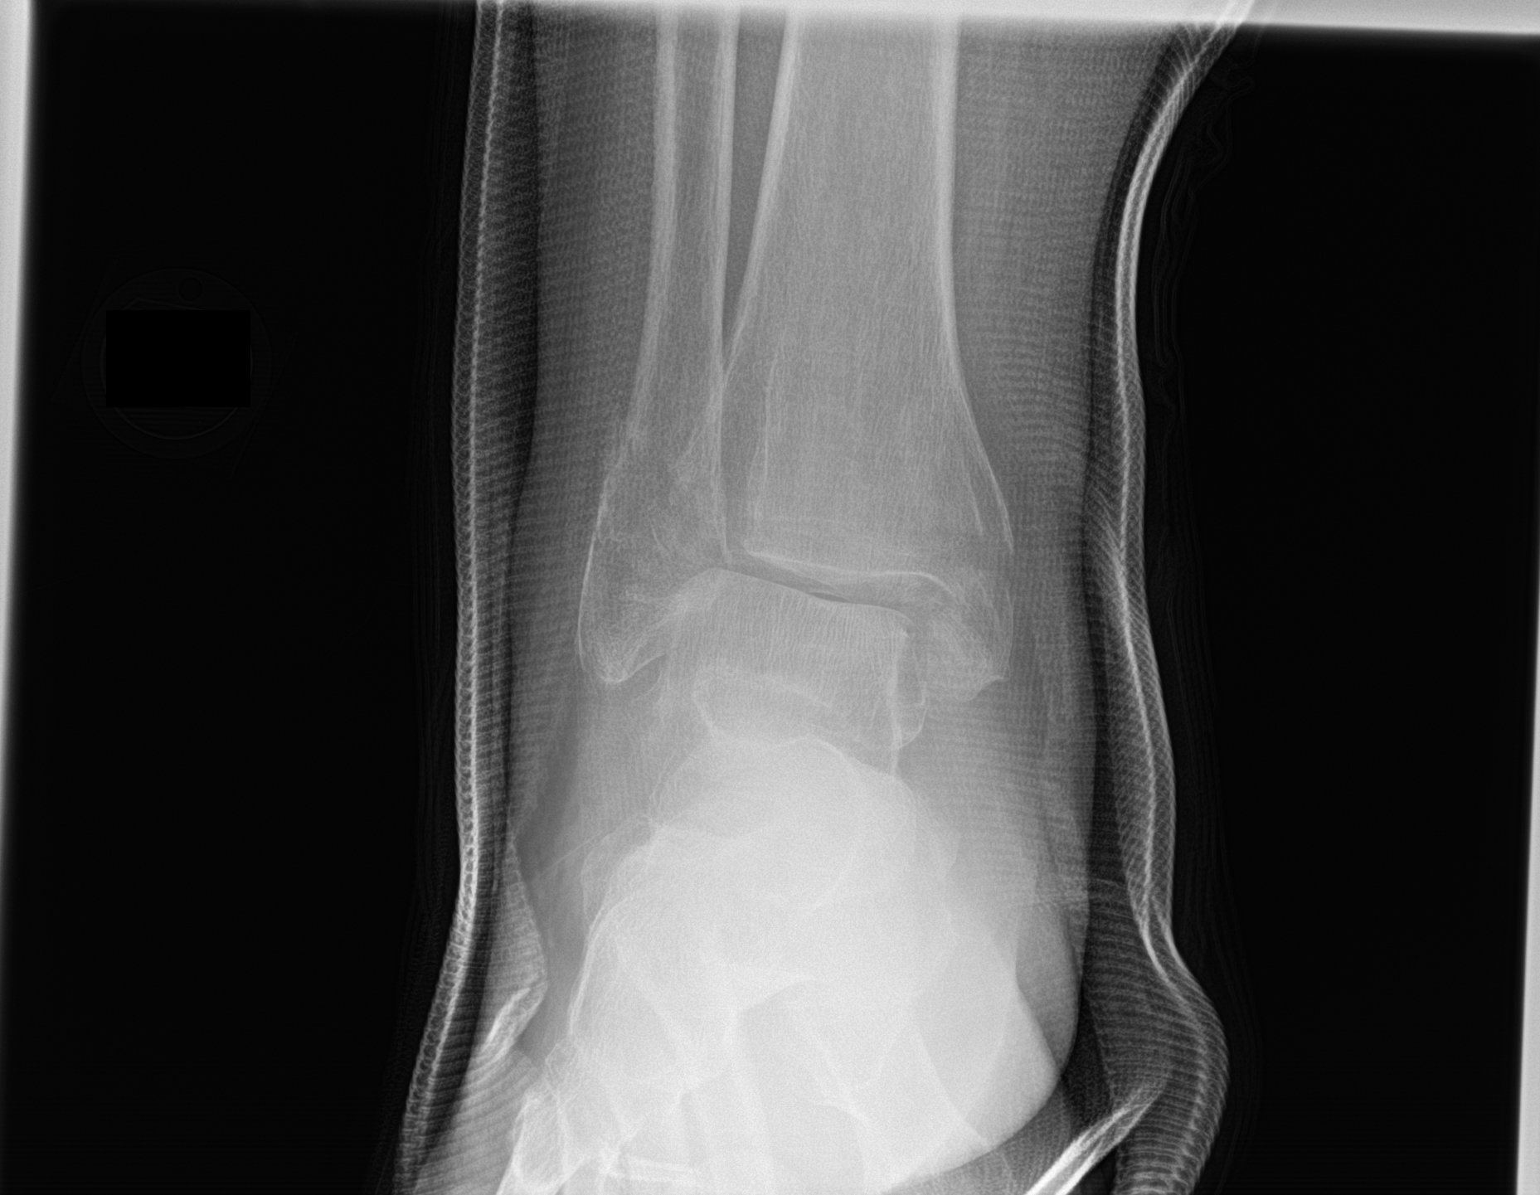

[ankle lat]
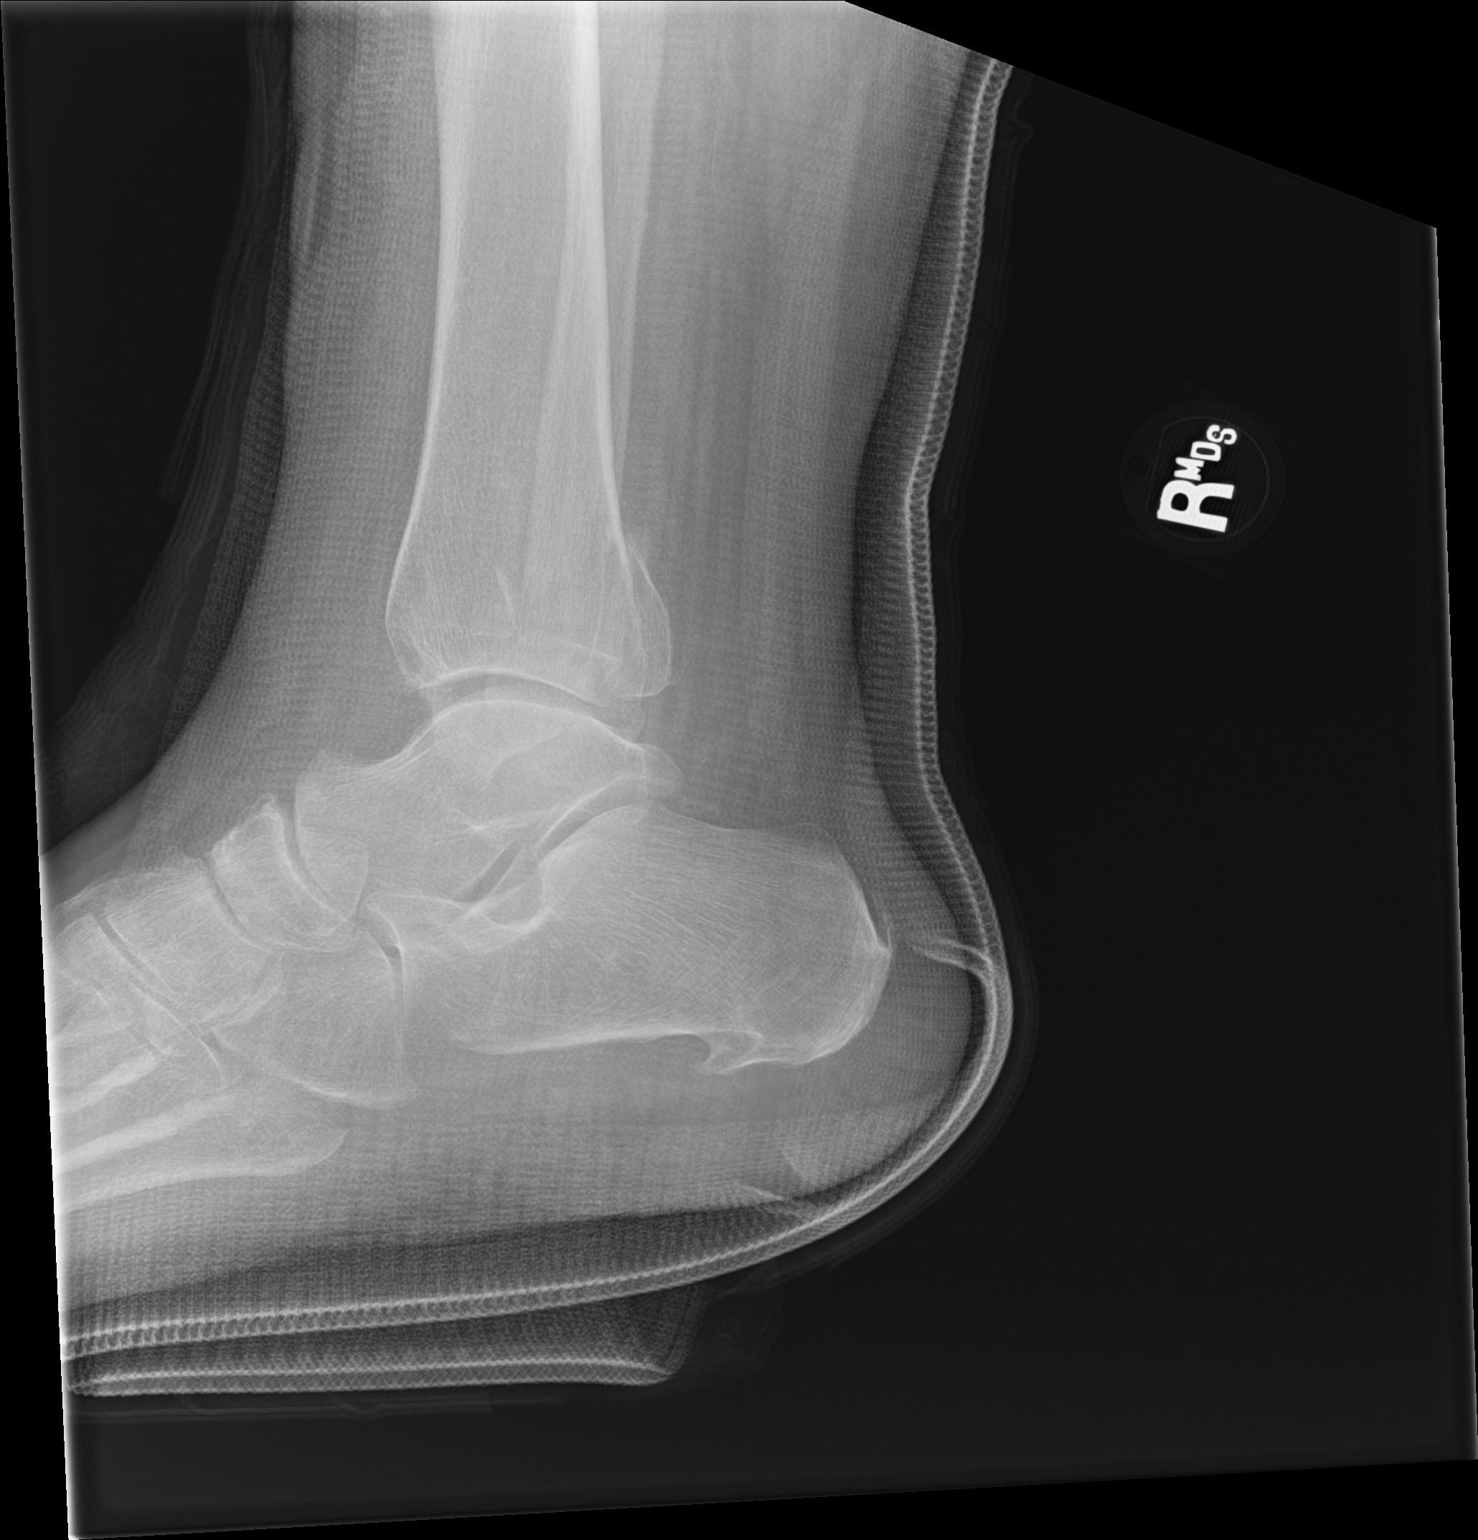

[2 of 2 positions shown; findings below may reference images not displayed]

FINDINGS: Trimalleolar fracture of the right ankle status post reduction.
Improved alignment of the distal fibular fracture, now near
anatomic. Improved alignment of the lateral malleolar fracture with
3-4 mm of inferolateral displacement. Posterior malleolar fracture
in similar alignment with approximately 2 mm of proximal
displacement. Improved alignment of the ankle mortise with mild
widening of the medial clear space. Diffuse soft tissue swelling.
IMPRESSION: 1. Trimalleolar fracture of the right ankle with improved alignment.
2. Improved alignment of the ankle mortise.

## 2022-02-06 IMAGING — DX DG ANKLE PORT 2V*R*
2 series · 2 of 2 positions shown · non-contrast
Comparison: Portable exam 6005 hours without priors for comparison

CLINICAL DATA: Fell today, deformity RIGHT ankle

EXAM:
PORTABLE RIGHT ANKLE - 2 VIEW

[ankle ap]
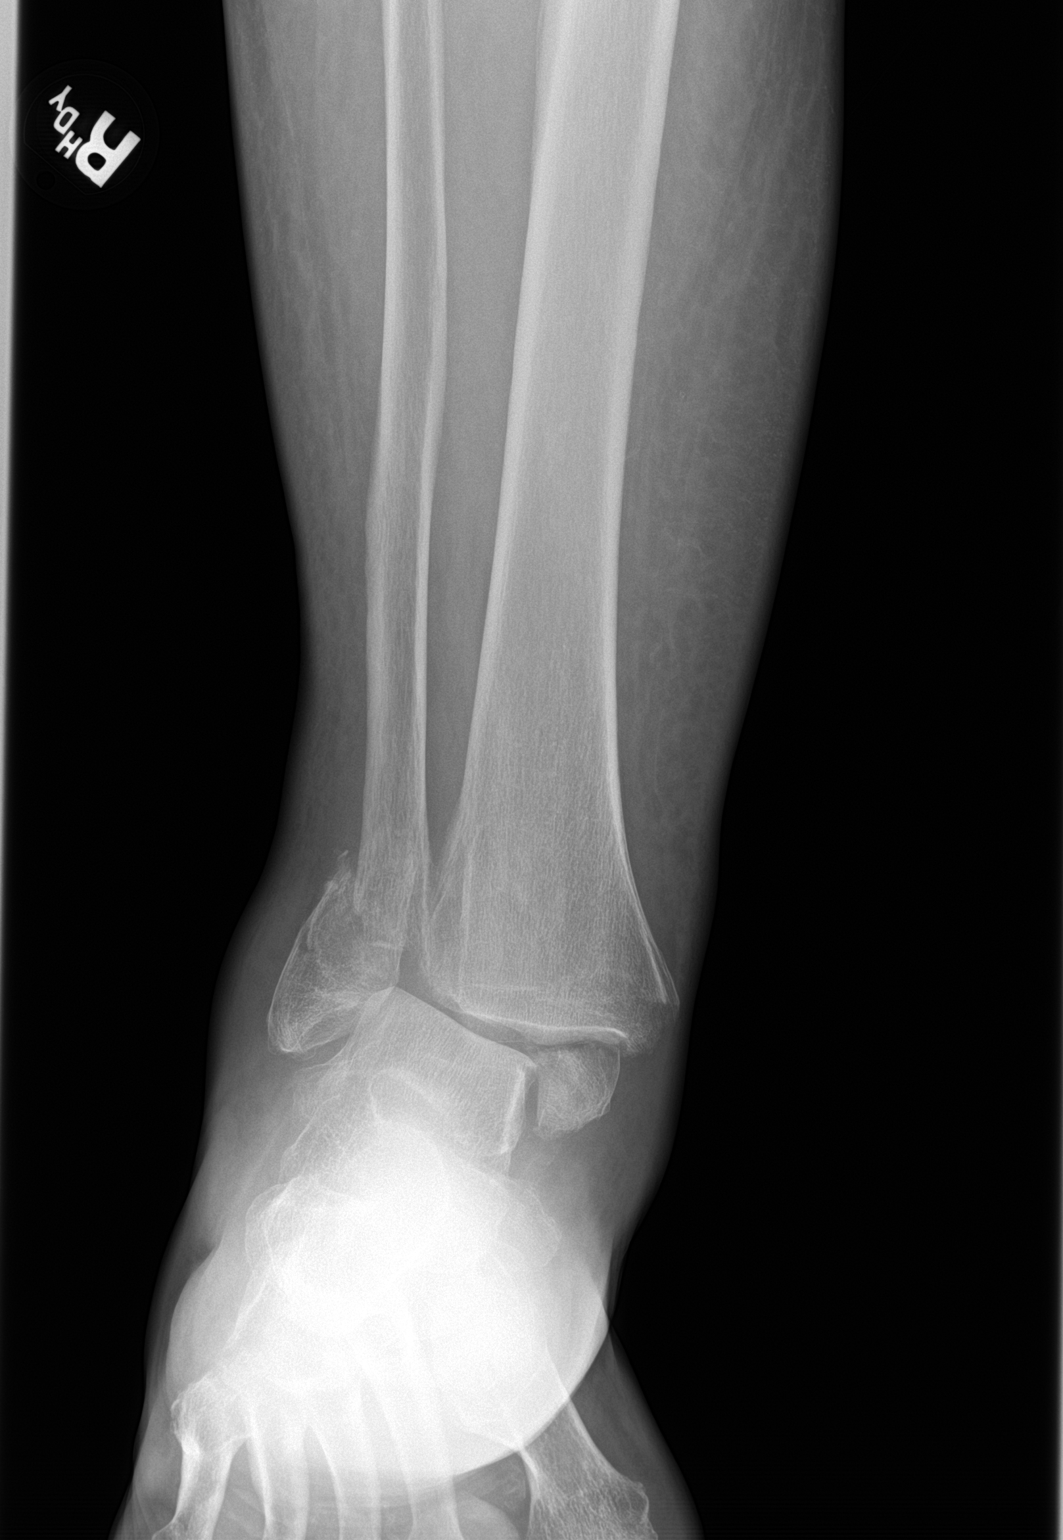

[ankle lat]
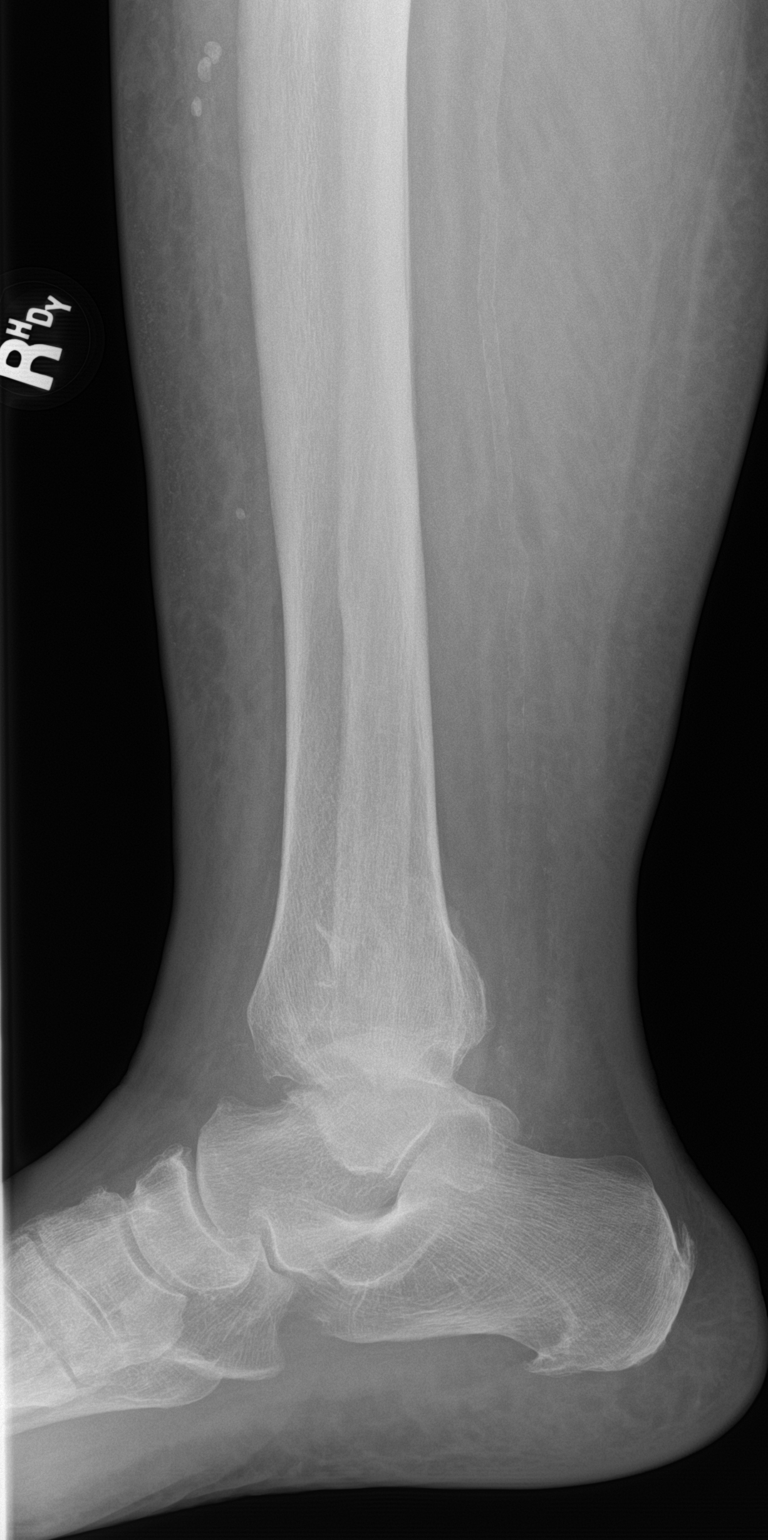

[2 of 2 positions shown; findings below may reference images not displayed]

FINDINGS: Osseous demineralization.

Transverse fracture medial malleolus displaced laterally.

Oblique fracture lateral malleolus, displaced laterally.

Lateral subluxation of tibia without gross dislocation.

Questionable posterior malleolar fracture on lateral view.

No additional fracture, dislocation or bone destruction.

Small plantar and Achilles insertion calcaneal spurs.
IMPRESSION: Laterally displaced bimalleolar fractures of the RIGHT ankle.

Questionable posterior malleolar fracture versus artifact at
posterior margin of RIGHT distal tibia.

## 2022-09-24 ENCOUNTER — Other Ambulatory Visit
Admission: RE | Admit: 2022-09-24 | Discharge: 2022-09-24 | Disposition: A | Discharge status: Home / Self Care | Payer: Medicare HMO | Source: Ambulatory Visit | Attending: Internal Medicine | Admitting: Internal Medicine

## 2022-09-24 DIAGNOSIS — M79605 Pain in left leg: Secondary | ICD-10-CM | POA: Diagnosis present

## 2022-09-24 LAB — D-DIMER, QUANTITATIVE: D-Dimer, Quant: 0.42 ug{FEU}/mL (ref 0.00–0.50)

## 2023-06-22 ENCOUNTER — Ambulatory Visit

## 2023-06-22 ENCOUNTER — Encounter
Admission: RE | Disposition: A | Discharge status: Home / Self Care | Payer: Self-pay | Source: Home / Self Care | Attending: Internal Medicine

## 2023-06-22 ENCOUNTER — Ambulatory Visit
Admission: RE | Admit: 2023-06-22 | Discharge: 2023-06-22 | Disposition: A | Discharge status: Home / Self Care | Payer: Medicare HMO | Attending: Internal Medicine | Admitting: Internal Medicine

## 2023-06-22 ENCOUNTER — Encounter: Payer: Self-pay | Admitting: Internal Medicine

## 2023-06-22 DIAGNOSIS — Z1211 Encounter for screening for malignant neoplasm of colon: Secondary | ICD-10-CM | POA: Insufficient documentation

## 2023-06-22 DIAGNOSIS — Q438 Other specified congenital malformations of intestine: Secondary | ICD-10-CM | POA: Insufficient documentation

## 2023-06-22 HISTORY — DX: Personal history of urinary calculi: Z87.442

## 2023-06-22 LAB — GLUCOSE, CAPILLARY: Glucose-Capillary: 90 mg/dL (ref 70–99)

## 2023-06-22 SURGERY — COLONOSCOPY WITH PROPOFOL
Anesthesia: General

## 2023-06-22 MED ORDER — PROPOFOL 10 MG/ML IV BOLUS
INTRAVENOUS | Status: AC
  Filled 2023-06-22: qty 20

## 2023-06-22 MED ORDER — SODIUM CHLORIDE 0.9 % IV SOLN: INTRAVENOUS | Status: DC

## 2023-06-22 MED ORDER — STERILE WATER FOR IRRIGATION IR SOLN
Status: DC | PRN
  Administered 2023-06-22: 50 mL

## 2023-06-22 MED ORDER — PROPOFOL 1000 MG/100ML IV EMUL
INTRAVENOUS | Status: AC
  Filled 2023-06-22: qty 100

## 2023-06-22 MED ORDER — PROPOFOL 500 MG/50ML IV EMUL
INTRAVENOUS | Status: DC | PRN
  Administered 2023-06-22: 125 ug/kg/min via INTRAVENOUS

## 2023-06-22 MED ORDER — PROPOFOL 10 MG/ML IV BOLUS
INTRAVENOUS | Status: DC | PRN
  Administered 2023-06-22: 40 mg via INTRAVENOUS

## 2023-06-22 MED ORDER — PHENYLEPHRINE 80 MCG/ML (10ML) SYRINGE FOR IV PUSH (FOR BLOOD PRESSURE SUPPORT)
PREFILLED_SYRINGE | INTRAVENOUS | Status: DC | PRN
  Administered 2023-06-22: 20 ug via INTRAVENOUS

## 2023-06-22 NOTE — Anesthesia Postprocedure Evaluation (Signed)
 Anesthesia Post Note  Patient: Taylor Massey  Procedure(s) Performed: COLONOSCOPY WITH PROPOFOL   Patient location during evaluation: Endoscopy Anesthesia Type: General Level of consciousness: awake and alert Pain management: pain level controlled Vital Signs Assessment: post-procedure vital signs reviewed and stable Respiratory status: spontaneous breathing, nonlabored ventilation, respiratory function stable and patient connected to nasal cannula oxygen Cardiovascular status: blood pressure returned to baseline and stable Postop Assessment: no apparent nausea or vomiting Anesthetic complications: no  No notable events documented.   Last Vitals:  Vitals:   06/22/23 0905 06/22/23 0915  BP: 132/69 118/70  Pulse: 70 67  Resp: 16 12  Temp: (!) 36.1 C   SpO2: 95% 98%    Last Pain:  Vitals:   06/22/23 0915  TempSrc:   PainSc: 0-No pain                 Enrique Harvest

## 2023-06-22 NOTE — Anesthesia Procedure Notes (Signed)
 Date/Time: 06/22/2023 8:26 AM  Performed by: Racheal Buddle, CRNAPre-anesthesia Checklist: Patient identified, Emergency Drugs available, Suction available and Patient being monitored Patient Re-evaluated:Patient Re-evaluated prior to induction Oxygen Delivery Method: Nasal cannula Induction Type: IV induction Dental Injury: Teeth and Oropharynx as per pre-operative assessment  Comments: Nasal cannula with etCO2 monitoring

## 2023-06-22 NOTE — Transfer of Care (Signed)
 Immediate Anesthesia Transfer of Care Note  Patient: Taylor Massey  Procedure(s) Performed: Procedure(s) with comments: COLONOSCOPY WITH PROPOFOL  (N/A) - DM  Patient Location: PACU and Endoscopy Unit  Anesthesia Type:General  Level of Consciousness: sedated  Airway & Oxygen Therapy: Patient Spontanous Breathing and Patient connected to nasal cannula oxygen  Post-op Assessment: Report given to RN and Post -op Vital signs reviewed and stable  Post vital signs: Reviewed and stable  Last Vitals:  Vitals:   06/22/23 0730 06/22/23 0905  BP: 129/78 132/69  Pulse: (!) 55 70  Resp: 18 16  Temp: (!) 36.3 C (!) 36.1 C  SpO2: 100% 95%    Complications: No apparent anesthesia complications

## 2023-06-22 NOTE — Anesthesia Preprocedure Evaluation (Signed)
 Anesthesia Evaluation  Patient identified by MRN, date of birth, ID band Patient awake    Reviewed: Allergy & Precautions, H&P , NPO status , Patient's Chart, lab work & pertinent test results  History of Anesthesia Complications Negative for: history of anesthetic complications  Airway Mallampati: II  TM Distance: >3 FB     Dental  (+) Teeth Intact   Pulmonary sleep apnea and Continuous Positive Airway Pressure Ventilation , neg COPD   breath sounds clear to auscultation       Cardiovascular hypertension, (-) angina (-) Past MI and (-) Cardiac Stents (-) dysrhythmias  Rhythm:regular Rate:Normal     Neuro/Psych negative neurological ROS  negative psych ROS   GI/Hepatic negative GI ROS, Neg liver ROS,,,  Endo/Other  diabetes    Renal/GU      Musculoskeletal   Abdominal   Peds  Hematology negative hematology ROS (+)   Anesthesia Other Findings Past Medical History: No date: Cholelithiasis No date: DDD (degenerative disc disease), lumbar No date: Diabetes mellitus without complication (HCC) No date: Hyperlipemia No date: Hypertension No date: Sleep apnea  Past Surgical History: No date: BARIATRIC SURGERY No date: CHOLECYSTECTOMY No date: COLONOSCOPY No date: HERNIA REPAIR     Comment:  HIATAL DONE WITH BARIATIC No date: LUMBAR MICRODISCECTOMY  BMI    Body Mass Index: 38.26 kg/m      Reproductive/Obstetrics negative OB ROS                             Anesthesia Physical Anesthesia Plan  ASA: 2  Anesthesia Plan: General   Post-op Pain Management: Minimal or no pain anticipated   Induction: Intravenous  PONV Risk Score and Plan: 3 and Propofol  infusion, TIVA and Ondansetron   Airway Management Planned: Nasal Cannula  Additional Equipment: None  Intra-op Plan:   Post-operative Plan:   Informed Consent: I have reviewed the patients History and Physical, chart, labs  and discussed the procedure including the risks, benefits and alternatives for the proposed anesthesia with the patient or authorized representative who has indicated his/her understanding and acceptance.     Dental advisory given  Plan Discussed with: CRNA and Surgeon  Anesthesia Plan Comments: (Discussed risks of anesthesia with patient, including possibility of difficulty with spontaneous ventilation under anesthesia necessitating airway intervention, PONV, and rare risks such as cardiac or respiratory or neurological events, and allergic reactions. Discussed the role of CRNA in patient's perioperative care. Patient understands.)       Anesthesia Quick Evaluation

## 2023-06-22 NOTE — H&P (Signed)
  Outpatient short stay form Pre-procedure 06/22/2023 8:23 AM Taylor Massey K. Corky Diener, M.D.  Primary Physician: Overton Blotter, M.D.  Reason for visit:  Colon cancer screening  History of present illness:  Patient presents for colonoscopy for colon cancer screening. The patient denies complaints of abdominal pain, significant change in bowel habits, or rectal bleeding.      Current Facility-Administered Medications:    0.9 %  sodium chloride  infusion, , Intravenous, Continuous, Zsofia Prout K, MD  Medications Prior to Admission  Medication Sig Dispense Refill Last Dose/Taking   atorvastatin  (LIPITOR) 10 MG tablet Take 10 mg by mouth daily.   06/21/2023   benazepril  (LOTENSIN ) 20 MG tablet Take 20 mg by mouth daily.   06/21/2023   Semaglutide,0.25 or 0.5MG /DOS, (OZEMPIC, 0.25 OR 0.5 MG/DOSE,) 2 MG/1.5ML SOPN Inject 0.5 mg into the skin once a week.   06/08/2023   aspirin  EC 81 MG tablet Take 1 tablet (81 mg total) by mouth in the morning and at bedtime. Swallow whole. 30 tablet 11    Calcium  Citrate-Vitamin D  (CALCIUM  CITRATE + D PO) Take 1 tablet by mouth 3 (three) times daily. Chews 500 calcium  500 units vitamin D3      HYDROcodone -acetaminophen  (NORCO) 7.5-325 MG tablet Take 1 tablet by mouth every 6 (six) hours as needed for moderate pain.      Multiple Vitamins-Minerals (BARIATRIC MULTIVITAMINS/IRON PO) Take 2 tablets by mouth daily.      naproxen  (NAPROSYN ) 500 MG tablet Take 1 tablet (500 mg total) by mouth 2 (two) times daily with a meal. 20 tablet 2    ondansetron  (ZOFRAN -ODT) 4 MG disintegrating tablet Take 1 tablet (4 mg total) by mouth every 8 (eight) hours as needed for nausea or vomiting. 20 tablet 0    tamsulosin  (FLOMAX ) 0.4 MG CAPS capsule Take 1 capsule (0.4 mg total) by mouth daily. (Patient not taking: Reported on 06/22/2023) 7 capsule 0 Not Taking     No Known Allergies   Past Medical History:  Diagnosis Date   Cholelithiasis    DDD (degenerative disc disease),  lumbar    Diabetes mellitus without complication (HCC)    History of kidney stones    Hyperlipemia    Hypertension    Sleep apnea     Review of systems:  Otherwise negative.    Physical Exam  Gen: Alert, oriented. Appears stated age.  HEENT: [place]/AT. PERRLA. Lungs: CTA, no wheezes. CV: RR nl S1, S2. Abd: soft, benign, no masses. BS+ Ext: No edema. Pulses 2+    Planned procedures: Proceed with colonoscopy. The patient understands the nature of the planned procedure, indications, risks, alternatives and potential complications including but not limited to bleeding, infection, perforation, damage to internal organs and possible oversedation/side effects from anesthesia. The patient agrees and gives consent to proceed.  Please refer to procedure notes for findings, recommendations and patient disposition/instructions.     [name] K. Corky Diener, M.D. Gastroenterology 06/22/2023  8:23 AM

## 2023-06-22 NOTE — Interval H&P Note (Signed)
 History and Physical Interval Note:  06/22/2023 8:24 AM  Taylor Massey  has presented today for surgery, with the diagnosis of Z12.11 (ICD-10-CM) - Colon cancer screening Z86.0100 (ICD-10-CM) - History of colon polyps.  The various methods of treatment have been discussed with the patient and family. After consideration of risks, benefits and other options for treatment, the patient has consented to  Procedure(s) with comments: COLONOSCOPY WITH PROPOFOL  (N/A) - DM as a surgical intervention.  The patient's history has been reviewed, patient examined, no change in status, stable for surgery.  I have reviewed the patient's chart and labs.  Questions were answered to the patient's satisfaction.     [place], Anatasia Tino

## 2023-06-22 NOTE — Op Note (Addendum)
 [facility] Gastroenterology Patient Name: Taylor Massey Procedure Date: 06/22/2023 7:56 AM MRN: [id number] Account #: [id number] Date of Birth: [birth year] Admit Type: Outpatient Age: 63 Room: [facility] ENDO ROOM 3 Gender: Male Note Status: Finalized Instrument Name: Charlyn Cooley [id number] Procedure:             Colonoscopy Indications:           High risk colon cancer surveillance: Personal history                         of colonic polyps Providers:             Divon Krabill K. Corky Diener MD, MD Referring MD:          Huel Madison. Alva Jewels MD, MD (Referring MD) Medicines:             Propofol  per Anesthesia Complications:         No immediate complications. Estimated blood loss: None. Procedure:             Pre-Anesthesia Assessment:                        - The risks and benefits of the procedure and the                         sedation options and risks were discussed with the                         patient. All questions were answered and informed                         consent was obtained.                        - Patient identification and proposed procedure were                         verified prior to the procedure by the nurse. The                         procedure was verified in the procedure room.                        - ASA Grade Assessment: III - A patient with severe                         systemic disease.                        - After reviewing the risks and benefits, the patient                         was deemed in satisfactory condition to undergo the                         procedure.                        After obtaining informed consent, the colonoscope was  passed under direct vision. Throughout the procedure,                         the patient's blood pressure, pulse, and oxygen                         saturations were monitored continuously. The                         Colonoscope was introduced through the anus and                          advanced to the the cecum, identified by appendiceal                         orifice and ileocecal valve. The colonoscopy was                         technically difficult and complex due to a redundant                         colon, significant looping, a tortuous colon and the                         patient's body habitus. Successful completion of the                         procedure was aided by applying abdominal pressure and                         receiving assistance from additional staff. Water                         immersion performed in the right colon AIDED cecal                         intubation. Patient was also placed in the semi-prone                         position. The ileocecal valve, appendiceal orifice,                         and rectum were photographed. Findings:      The perianal and digital rectal examinations were normal. Pertinent       negatives include normal sphincter tone and no palpable rectal lesions.      The colon (entire examined portion) was significantly redundant.      The exam was otherwise without abnormality on direct and retroflexion       views. Impression:            - Redundant colon.                        - The examination was otherwise normal on direct and                         retroflexion views.                        -  No specimens collected. Recommendation:        - Patient has a contact number available for                         emergencies. The signs and symptoms of potential                         delayed complications were discussed with the patient.                         Return to normal activities tomorrow. Written                         discharge instructions were provided to the patient.                        - Resume previous diet.                        - Continue present medications.                        - Repeat colonoscopy in 10 years for screening                         purposes.                         - Return to GI office PRN.                        - The findings and recommendations were discussed with                         the patient. Procedure Code(s):     --- Professional ---                        Z6109, Colorectal cancer screening; colonoscopy on                         individual not meeting criteria for high risk Diagnosis Code(s):     --- Professional ---                        Z12.11, Encounter for screening for malignant neoplasm                         of colon                        Q43.8, Other specified congenital malformations of                         intestine CPT copyright 2022 [employer]. All rights reserved. The codes documented in this report are preliminary and upon coder review may  be revised to meet current compliance requirements. Cassie Click MD, MD 06/22/2023 9:03:34 AM This report has been signed electronically. Number of Addenda: 0 Note Initiated On: 06/22/2023 7:56 AM Scope Withdrawal Time: 0 hours 19 minutes 5 seconds  Total Procedure Duration: 0 hours 28 minutes 47 seconds  Estimated Blood Loss:  Estimated blood loss: none.      [facility]

## 2023-06-23 ENCOUNTER — Encounter: Payer: Self-pay | Admitting: Internal Medicine
